# Patient Record
Sex: Male | Born: 1945
Health system: Southern US, Community
[De-identification: ages and names within clinical notes are randomized; demographics above are authoritative.]

## PROBLEM LIST (undated history)

## (undated) DIAGNOSIS — J449 Chronic obstructive pulmonary disease, unspecified: Secondary | ICD-10-CM

## (undated) DIAGNOSIS — J302 Other seasonal allergic rhinitis: Secondary | ICD-10-CM

## (undated) DIAGNOSIS — H409 Unspecified glaucoma: Secondary | ICD-10-CM

## (undated) DIAGNOSIS — I10 Essential (primary) hypertension: Secondary | ICD-10-CM

## (undated) HISTORY — DX: Chronic obstructive pulmonary disease, unspecified: J44.9

## (undated) HISTORY — DX: Other seasonal allergic rhinitis: J30.2

## (undated) HISTORY — PX: HERNIA REPAIR: SHX51

## (undated) HISTORY — DX: Essential (primary) hypertension: I10

## (undated) HISTORY — DX: Unspecified glaucoma: H40.9

---

## 2007-11-22 ENCOUNTER — Encounter: Admission: RE | Admit: 2007-11-22 | Discharge: 2007-11-22 | Payer: Self-pay | Admitting: Orthopedic Surgery

## 2008-02-19 ENCOUNTER — Encounter: Admission: RE | Admit: 2008-02-19 | Discharge: 2008-02-23 | Payer: Self-pay | Admitting: Orthopedic Surgery

## 2013-03-05 ENCOUNTER — Ambulatory Visit
Admission: RE | Admit: 2013-03-05 | Discharge: 2013-03-05 | Disposition: A | Discharge status: Home / Self Care | Payer: No Typology Code available for payment source | Source: Ambulatory Visit | Attending: Family Medicine | Admitting: Family Medicine

## 2013-03-05 ENCOUNTER — Other Ambulatory Visit: Payer: Self-pay | Admitting: Family Medicine

## 2013-03-05 DIAGNOSIS — Z006 Encounter for examination for normal comparison and control in clinical research program: Secondary | ICD-10-CM

## 2014-11-11 ENCOUNTER — Other Ambulatory Visit: Payer: Self-pay | Admitting: Chiropractic Medicine

## 2014-11-11 ENCOUNTER — Ambulatory Visit
Admission: RE | Admit: 2014-11-11 | Discharge: 2014-11-11 | Disposition: A | Discharge status: Home / Self Care | Payer: Medicare Other | Source: Ambulatory Visit | Attending: Chiropractic Medicine | Admitting: Chiropractic Medicine

## 2014-11-11 DIAGNOSIS — M542 Cervicalgia: Secondary | ICD-10-CM

## 2015-03-11 ENCOUNTER — Ambulatory Visit
Admission: RE | Admit: 2015-03-11 | Discharge: 2015-03-11 | Disposition: A | Discharge status: Home / Self Care | Payer: No Typology Code available for payment source | Source: Ambulatory Visit | Attending: Family Medicine | Admitting: Family Medicine

## 2015-03-11 ENCOUNTER — Other Ambulatory Visit: Payer: Self-pay | Admitting: Family Medicine

## 2015-03-11 DIAGNOSIS — Z006 Encounter for examination for normal comparison and control in clinical research program: Secondary | ICD-10-CM

## 2016-10-07 DIAGNOSIS — R0600 Dyspnea, unspecified: Secondary | ICD-10-CM | POA: Diagnosis not present

## 2016-10-07 DIAGNOSIS — R0689 Other abnormalities of breathing: Secondary | ICD-10-CM | POA: Diagnosis not present

## 2016-10-07 DIAGNOSIS — R0602 Shortness of breath: Secondary | ICD-10-CM | POA: Diagnosis not present

## 2016-10-07 DIAGNOSIS — R069 Unspecified abnormalities of breathing: Secondary | ICD-10-CM | POA: Diagnosis not present

## 2016-10-07 DIAGNOSIS — J441 Chronic obstructive pulmonary disease with (acute) exacerbation: Secondary | ICD-10-CM | POA: Diagnosis not present

## 2016-10-07 DIAGNOSIS — J449 Chronic obstructive pulmonary disease, unspecified: Secondary | ICD-10-CM | POA: Diagnosis not present

## 2016-10-08 DIAGNOSIS — H401122 Primary open-angle glaucoma, left eye, moderate stage: Secondary | ICD-10-CM | POA: Diagnosis not present

## 2016-11-02 DIAGNOSIS — I10 Essential (primary) hypertension: Secondary | ICD-10-CM | POA: Diagnosis not present

## 2016-11-02 DIAGNOSIS — J449 Chronic obstructive pulmonary disease, unspecified: Secondary | ICD-10-CM | POA: Diagnosis not present

## 2016-11-02 DIAGNOSIS — Z6823 Body mass index (BMI) 23.0-23.9, adult: Secondary | ICD-10-CM | POA: Diagnosis not present

## 2016-11-11 DIAGNOSIS — Z6822 Body mass index (BMI) 22.0-22.9, adult: Secondary | ICD-10-CM | POA: Diagnosis not present

## 2016-11-11 DIAGNOSIS — Z1211 Encounter for screening for malignant neoplasm of colon: Secondary | ICD-10-CM | POA: Diagnosis not present

## 2016-11-11 DIAGNOSIS — Z125 Encounter for screening for malignant neoplasm of prostate: Secondary | ICD-10-CM | POA: Diagnosis not present

## 2016-11-11 DIAGNOSIS — Z13220 Encounter for screening for lipoid disorders: Secondary | ICD-10-CM | POA: Diagnosis not present

## 2016-11-11 DIAGNOSIS — Z0000 Encounter for general adult medical examination without abnormal findings: Secondary | ICD-10-CM | POA: Diagnosis not present

## 2016-11-11 DIAGNOSIS — Z131 Encounter for screening for diabetes mellitus: Secondary | ICD-10-CM | POA: Diagnosis not present

## 2016-11-12 DIAGNOSIS — J449 Chronic obstructive pulmonary disease, unspecified: Secondary | ICD-10-CM | POA: Diagnosis not present

## 2016-11-19 DIAGNOSIS — H401122 Primary open-angle glaucoma, left eye, moderate stage: Secondary | ICD-10-CM | POA: Diagnosis not present

## 2016-12-02 DIAGNOSIS — H401122 Primary open-angle glaucoma, left eye, moderate stage: Secondary | ICD-10-CM | POA: Diagnosis not present

## 2016-12-13 DIAGNOSIS — H25812 Combined forms of age-related cataract, left eye: Secondary | ICD-10-CM | POA: Diagnosis not present

## 2016-12-13 DIAGNOSIS — J449 Chronic obstructive pulmonary disease, unspecified: Secondary | ICD-10-CM | POA: Diagnosis not present

## 2016-12-22 DIAGNOSIS — Z01818 Encounter for other preprocedural examination: Secondary | ICD-10-CM | POA: Diagnosis not present

## 2016-12-22 DIAGNOSIS — H2512 Age-related nuclear cataract, left eye: Secondary | ICD-10-CM | POA: Diagnosis not present

## 2016-12-22 DIAGNOSIS — H25812 Combined forms of age-related cataract, left eye: Secondary | ICD-10-CM | POA: Diagnosis not present

## 2017-01-10 DIAGNOSIS — M25511 Pain in right shoulder: Secondary | ICD-10-CM | POA: Diagnosis not present

## 2017-01-10 DIAGNOSIS — M62838 Other muscle spasm: Secondary | ICD-10-CM | POA: Diagnosis not present

## 2017-01-10 DIAGNOSIS — I700 Atherosclerosis of aorta: Secondary | ICD-10-CM | POA: Diagnosis not present

## 2017-01-11 DIAGNOSIS — M62838 Other muscle spasm: Secondary | ICD-10-CM | POA: Diagnosis not present

## 2017-01-11 DIAGNOSIS — Z9189 Other specified personal risk factors, not elsewhere classified: Secondary | ICD-10-CM | POA: Diagnosis not present

## 2017-01-11 DIAGNOSIS — Z6822 Body mass index (BMI) 22.0-22.9, adult: Secondary | ICD-10-CM | POA: Diagnosis not present

## 2017-01-12 DIAGNOSIS — J449 Chronic obstructive pulmonary disease, unspecified: Secondary | ICD-10-CM | POA: Diagnosis not present

## 2017-01-20 DIAGNOSIS — Z961 Presence of intraocular lens: Secondary | ICD-10-CM | POA: Diagnosis not present

## 2017-02-10 DIAGNOSIS — Z1389 Encounter for screening for other disorder: Secondary | ICD-10-CM | POA: Diagnosis not present

## 2017-02-10 DIAGNOSIS — Z139 Encounter for screening, unspecified: Secondary | ICD-10-CM | POA: Diagnosis not present

## 2017-02-10 DIAGNOSIS — Z6823 Body mass index (BMI) 23.0-23.9, adult: Secondary | ICD-10-CM | POA: Diagnosis not present

## 2017-02-10 DIAGNOSIS — I209 Angina pectoris, unspecified: Secondary | ICD-10-CM | POA: Diagnosis not present

## 2017-02-10 DIAGNOSIS — J449 Chronic obstructive pulmonary disease, unspecified: Secondary | ICD-10-CM | POA: Diagnosis not present

## 2017-02-10 DIAGNOSIS — R7309 Other abnormal glucose: Secondary | ICD-10-CM | POA: Diagnosis not present

## 2017-02-10 DIAGNOSIS — I10 Essential (primary) hypertension: Secondary | ICD-10-CM | POA: Diagnosis not present

## 2017-02-10 DIAGNOSIS — J302 Other seasonal allergic rhinitis: Secondary | ICD-10-CM | POA: Diagnosis not present

## 2017-02-11 DIAGNOSIS — J302 Other seasonal allergic rhinitis: Secondary | ICD-10-CM | POA: Diagnosis not present

## 2017-02-12 DIAGNOSIS — J449 Chronic obstructive pulmonary disease, unspecified: Secondary | ICD-10-CM | POA: Diagnosis not present

## 2017-02-14 DIAGNOSIS — J302 Other seasonal allergic rhinitis: Secondary | ICD-10-CM | POA: Diagnosis not present

## 2017-02-15 DIAGNOSIS — J302 Other seasonal allergic rhinitis: Secondary | ICD-10-CM | POA: Diagnosis not present

## 2017-02-16 DIAGNOSIS — J302 Other seasonal allergic rhinitis: Secondary | ICD-10-CM | POA: Diagnosis not present

## 2017-02-17 DIAGNOSIS — J302 Other seasonal allergic rhinitis: Secondary | ICD-10-CM | POA: Diagnosis not present

## 2017-02-18 DIAGNOSIS — J302 Other seasonal allergic rhinitis: Secondary | ICD-10-CM | POA: Diagnosis not present

## 2017-02-21 DIAGNOSIS — J302 Other seasonal allergic rhinitis: Secondary | ICD-10-CM | POA: Diagnosis not present

## 2017-03-14 DIAGNOSIS — J449 Chronic obstructive pulmonary disease, unspecified: Secondary | ICD-10-CM | POA: Diagnosis not present

## 2017-03-29 DIAGNOSIS — Z6822 Body mass index (BMI) 22.0-22.9, adult: Secondary | ICD-10-CM | POA: Diagnosis not present

## 2017-03-29 DIAGNOSIS — J181 Lobar pneumonia, unspecified organism: Secondary | ICD-10-CM | POA: Diagnosis not present

## 2017-03-29 DIAGNOSIS — Z139 Encounter for screening, unspecified: Secondary | ICD-10-CM | POA: Diagnosis not present

## 2017-03-30 DIAGNOSIS — R0602 Shortness of breath: Secondary | ICD-10-CM | POA: Diagnosis not present

## 2017-03-30 DIAGNOSIS — J181 Lobar pneumonia, unspecified organism: Secondary | ICD-10-CM | POA: Diagnosis not present

## 2017-03-30 DIAGNOSIS — R05 Cough: Secondary | ICD-10-CM | POA: Diagnosis not present

## 2017-03-30 DIAGNOSIS — Z6822 Body mass index (BMI) 22.0-22.9, adult: Secondary | ICD-10-CM | POA: Diagnosis not present

## 2017-04-07 DIAGNOSIS — J181 Lobar pneumonia, unspecified organism: Secondary | ICD-10-CM | POA: Diagnosis not present

## 2017-04-07 DIAGNOSIS — Z6822 Body mass index (BMI) 22.0-22.9, adult: Secondary | ICD-10-CM | POA: Diagnosis not present

## 2017-04-13 DIAGNOSIS — H40012 Open angle with borderline findings, low risk, left eye: Secondary | ICD-10-CM | POA: Diagnosis not present

## 2017-04-14 DIAGNOSIS — J449 Chronic obstructive pulmonary disease, unspecified: Secondary | ICD-10-CM | POA: Diagnosis not present

## 2017-05-02 DIAGNOSIS — E7889 Other lipoprotein metabolism disorders: Secondary | ICD-10-CM | POA: Diagnosis not present

## 2017-05-02 DIAGNOSIS — I10 Essential (primary) hypertension: Secondary | ICD-10-CM | POA: Diagnosis not present

## 2017-05-10 DIAGNOSIS — Z6822 Body mass index (BMI) 22.0-22.9, adult: Secondary | ICD-10-CM | POA: Diagnosis not present

## 2017-05-10 DIAGNOSIS — J449 Chronic obstructive pulmonary disease, unspecified: Secondary | ICD-10-CM | POA: Diagnosis not present

## 2017-05-10 DIAGNOSIS — Z7189 Other specified counseling: Secondary | ICD-10-CM | POA: Diagnosis not present

## 2017-05-10 DIAGNOSIS — I10 Essential (primary) hypertension: Secondary | ICD-10-CM | POA: Diagnosis not present

## 2017-05-11 DIAGNOSIS — Z9889 Other specified postprocedural states: Secondary | ICD-10-CM | POA: Diagnosis not present

## 2017-05-11 DIAGNOSIS — H401123 Primary open-angle glaucoma, left eye, severe stage: Secondary | ICD-10-CM | POA: Diagnosis not present

## 2017-05-15 DIAGNOSIS — J449 Chronic obstructive pulmonary disease, unspecified: Secondary | ICD-10-CM | POA: Diagnosis not present

## 2017-06-14 DIAGNOSIS — J449 Chronic obstructive pulmonary disease, unspecified: Secondary | ICD-10-CM | POA: Diagnosis not present

## 2017-07-13 DIAGNOSIS — H401123 Primary open-angle glaucoma, left eye, severe stage: Secondary | ICD-10-CM | POA: Diagnosis not present

## 2017-07-13 DIAGNOSIS — Z9889 Other specified postprocedural states: Secondary | ICD-10-CM | POA: Diagnosis not present

## 2017-07-15 DIAGNOSIS — J449 Chronic obstructive pulmonary disease, unspecified: Secondary | ICD-10-CM | POA: Diagnosis not present

## 2017-08-02 DIAGNOSIS — Z6822 Body mass index (BMI) 22.0-22.9, adult: Secondary | ICD-10-CM | POA: Diagnosis not present

## 2017-08-02 DIAGNOSIS — J441 Chronic obstructive pulmonary disease with (acute) exacerbation: Secondary | ICD-10-CM | POA: Diagnosis not present

## 2017-08-14 DIAGNOSIS — J449 Chronic obstructive pulmonary disease, unspecified: Secondary | ICD-10-CM | POA: Diagnosis not present

## 2017-09-14 DIAGNOSIS — J449 Chronic obstructive pulmonary disease, unspecified: Secondary | ICD-10-CM | POA: Diagnosis not present

## 2017-10-04 DIAGNOSIS — Z6822 Body mass index (BMI) 22.0-22.9, adult: Secondary | ICD-10-CM | POA: Diagnosis not present

## 2017-10-04 DIAGNOSIS — L97519 Non-pressure chronic ulcer of other part of right foot with unspecified severity: Secondary | ICD-10-CM | POA: Diagnosis not present

## 2017-10-04 DIAGNOSIS — M79674 Pain in right toe(s): Secondary | ICD-10-CM | POA: Diagnosis not present

## 2017-10-04 DIAGNOSIS — Z7189 Other specified counseling: Secondary | ICD-10-CM | POA: Diagnosis not present

## 2017-10-05 DIAGNOSIS — M79674 Pain in right toe(s): Secondary | ICD-10-CM | POA: Diagnosis not present

## 2017-10-05 DIAGNOSIS — M216X9 Other acquired deformities of unspecified foot: Secondary | ICD-10-CM | POA: Diagnosis not present

## 2017-10-05 DIAGNOSIS — M19071 Primary osteoarthritis, right ankle and foot: Secondary | ICD-10-CM | POA: Diagnosis not present

## 2017-10-15 DIAGNOSIS — J449 Chronic obstructive pulmonary disease, unspecified: Secondary | ICD-10-CM | POA: Diagnosis not present

## 2017-10-20 DIAGNOSIS — H54415A Blindness right eye category 5, normal vision left eye: Secondary | ICD-10-CM | POA: Diagnosis not present

## 2017-10-20 DIAGNOSIS — H401123 Primary open-angle glaucoma, left eye, severe stage: Secondary | ICD-10-CM | POA: Diagnosis not present

## 2017-10-20 DIAGNOSIS — H04123 Dry eye syndrome of bilateral lacrimal glands: Secondary | ICD-10-CM | POA: Diagnosis not present

## 2017-10-21 ENCOUNTER — Ambulatory Visit: Payer: Self-pay | Admitting: Sports Medicine

## 2017-10-31 DIAGNOSIS — I10 Essential (primary) hypertension: Secondary | ICD-10-CM | POA: Diagnosis not present

## 2017-10-31 DIAGNOSIS — E7889 Other lipoprotein metabolism disorders: Secondary | ICD-10-CM | POA: Diagnosis not present

## 2017-10-31 DIAGNOSIS — Z125 Encounter for screening for malignant neoplasm of prostate: Secondary | ICD-10-CM | POA: Diagnosis not present

## 2017-11-07 DIAGNOSIS — Z139 Encounter for screening, unspecified: Secondary | ICD-10-CM | POA: Diagnosis not present

## 2017-11-07 DIAGNOSIS — J449 Chronic obstructive pulmonary disease, unspecified: Secondary | ICD-10-CM | POA: Diagnosis not present

## 2017-11-07 DIAGNOSIS — R972 Elevated prostate specific antigen [PSA]: Secondary | ICD-10-CM | POA: Diagnosis not present

## 2017-11-07 DIAGNOSIS — I10 Essential (primary) hypertension: Secondary | ICD-10-CM | POA: Diagnosis not present

## 2017-11-07 DIAGNOSIS — Z0000 Encounter for general adult medical examination without abnormal findings: Secondary | ICD-10-CM | POA: Diagnosis not present

## 2017-11-07 DIAGNOSIS — Z1331 Encounter for screening for depression: Secondary | ICD-10-CM | POA: Diagnosis not present

## 2017-11-12 DIAGNOSIS — J449 Chronic obstructive pulmonary disease, unspecified: Secondary | ICD-10-CM | POA: Diagnosis not present

## 2017-11-29 DIAGNOSIS — H02135 Senile ectropion of left lower eyelid: Secondary | ICD-10-CM | POA: Diagnosis not present

## 2017-11-29 DIAGNOSIS — H02132 Senile ectropion of right lower eyelid: Secondary | ICD-10-CM | POA: Diagnosis not present

## 2017-12-07 DIAGNOSIS — J449 Chronic obstructive pulmonary disease, unspecified: Secondary | ICD-10-CM | POA: Diagnosis not present

## 2017-12-07 DIAGNOSIS — I10 Essential (primary) hypertension: Secondary | ICD-10-CM | POA: Diagnosis not present

## 2017-12-14 DIAGNOSIS — J449 Chronic obstructive pulmonary disease, unspecified: Secondary | ICD-10-CM | POA: Diagnosis not present

## 2017-12-14 DIAGNOSIS — Z6822 Body mass index (BMI) 22.0-22.9, adult: Secondary | ICD-10-CM | POA: Diagnosis not present

## 2017-12-20 ENCOUNTER — Encounter: Payer: Self-pay | Admitting: Pulmonary Disease

## 2017-12-21 ENCOUNTER — Ambulatory Visit: Payer: Medicare Other | Admitting: Pulmonary Disease

## 2017-12-21 ENCOUNTER — Encounter: Payer: Self-pay | Admitting: Pulmonary Disease

## 2017-12-21 VITALS — BP 108/74 | HR 96 | Ht 72.0 in | Wt 158.6 lb

## 2017-12-21 DIAGNOSIS — J449 Chronic obstructive pulmonary disease, unspecified: Secondary | ICD-10-CM

## 2017-12-21 DIAGNOSIS — R911 Solitary pulmonary nodule: Secondary | ICD-10-CM

## 2017-12-21 DIAGNOSIS — Z720 Tobacco use: Secondary | ICD-10-CM | POA: Diagnosis not present

## 2017-12-21 HISTORY — DX: Tobacco use: Z72.0

## 2017-12-21 HISTORY — DX: Solitary pulmonary nodule: R91.1

## 2017-12-21 MED ORDER — FLUTICASONE FUROATE-VILANTEROL 200-25 MCG/INH IN AEPB: 1.0000 | INHALATION_SPRAY | Freq: Every day | RESPIRATORY_TRACT | 0 refills | Status: DC

## 2017-12-21 MED ORDER — TIOTROPIUM BROMIDE MONOHYDRATE 18 MCG IN CAPS: 18.0000 ug | ORAL_CAPSULE | Freq: Every day | RESPIRATORY_TRACT | 6 refills | Status: DC

## 2017-12-21 NOTE — Progress Notes (Signed)
Subjective:    Patient ID: Daniel Tran, male    DOB: 22-Sep-1945, 72 y.o.   MRN: [id number]  HPI  72 year old smoker presents to establish care for COPD. He smoked 2 packs/day starting at age 72 until about 2 years ago, more than 80 pack years.  He now smokes 4-5 cigars daily.  He reports increased shortness of breath ongoing for about 6 months.  He was diagnosed with "COPD" and maintained on Spiriva for many years.  He established with a new PCP 3 months ago and at that time he was started on albuterol and Atrovent nebs via nebulizer and Spiriva was discontinued.  He was also started on budesonide nebs which caused him to develop a cough.  He self discontinued this and his next PCP visit he was given a sample of glycopyrrolate nebs Seebri -he does not feel that this is helped him much. He is able to carry out his daily activities including mowing the yard or walk around in the store. He denies frequent chest colds.  He does report a daily cough productive of clear white sputum.  He reports intermittent wheezing. He is accompanied by his daughter and wife were very concerned about his breathing. 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  He denies any exposure to asbestos  Significant tests/ events reviewed  Spirometry 12/2017 -download ratio of 38, FEV1 26%, FVC 52%  CT chest 08/2015 showed 6 mm left lower lobe nodule and scattered pleural-based calcifications   Past Medical History:  Diagnosis Date  . COPD (chronic obstructive pulmonary disease) (HCC)   . Hypertension, benign   . Seasonal allergic rhinitis     No Known Allergies  Social History   Socioeconomic History  . Marital status: Married    Spouse name: Not on file  . Number of children: Not on file  . Years of education: Not on file  . Highest education level: Not on  file  Occupational History  . Not on file  Social Needs  . Financial resource strain: Not on file  . Food insecurity:    Worry: Not on file    Inability: Not on file  . Transportation needs:    Medical: Not on file    Non-medical: Not on file  Tobacco Use  . Smoking status: Current Every Day Smoker    Packs/day: 2.00    Years: 60.00    Pack years: 120.00    Types: Cigarettes, Cigars  . Smokeless tobacco: Never Used  . Tobacco comment: quit cigarettes 3 years ago but still smoking cigars  Substance and Sexual Activity  . Alcohol use: Not on file  . Drug use: Not on file  . Sexual activity: Not on file  Lifestyle  . Physical activity:    Days per week: Not on file    Minutes per session: Not on file  . Stress: Not on file  Relationships  . Social connections:    Talks on phone: Not on file    Gets together: Not on file    Attends religious service: Not on file    Active member of club or organization: Not on file    Attends meetings of clubs or organizations: Not on file    Relationship status: Not on file  . Intimate partner violence:    Fear of current or ex partner:  Not on file    Emotionally abused: Not on file    Physically abused: Not on file    Forced sexual activity: Not on file  Other Topics Concern  . Not on file  Social History Narrative  . Not on file     No family history on file. Heart disease in his father at an older age  Review of Systems  Constitutional: Positive for unexpected weight change. Negative for fever.  HENT: Positive for sore throat. Negative for congestion, dental problem, ear pain, nosebleeds, postnasal drip, rhinorrhea, sinus pressure, sneezing and trouble swallowing.   Eyes: Positive for redness and itching.  Respiratory: Positive for cough, chest tightness, shortness of breath and wheezing.   Cardiovascular: Negative for palpitations and leg swelling.  Gastrointestinal: Negative for nausea and vomiting.  Genitourinary: Negative  for dysuria.  Musculoskeletal: Negative for joint swelling.  Skin: Negative for rash.  Allergic/Immunologic: Negative.  Negative for environmental allergies, food allergies and immunocompromised state.  Neurological: Negative for headaches.  Hematological: Does not bruise/bleed easily.  Psychiatric/Behavioral: Negative for dysphoric mood. The patient is not nervous/anxious.        Objective:   Physical Exam  Gen. Pleasant, well-nourished, in no distress, normal affect ENT - no lesions, no post nasal drip Neck: No JVD, no thyromegaly, no carotid bruits Lungs: no use of accessory muscles, no dullness to percussion, decreased  without rales or rhonchi  Cardiovascular: Rhythm regular, heart sounds  normal, no murmurs or gallops, no peripheral edema Abdomen: soft and non-tender, no hepatosplenomegaly, BS normal. Musculoskeletal: No deformities, no cyanosis or clubbing Neuro:  alert, non focal       Assessment & Plan:

## 2017-12-21 NOTE — Assessment & Plan Note (Signed)
Smoking cessation paramount here and was emphasized to patient and family

## 2017-12-21 NOTE — Patient Instructions (Signed)
You have severe COPD, lung capacity is a 26%.  You have to stop smoking completely!.  Start Spiriva once daily Start Brio once daily, sample-rinse mouth after use Call back for prescription if this works  CT chest without contrast to follow-up on nodule in your left lower lung

## 2017-12-21 NOTE — Assessment & Plan Note (Signed)
We will obtain 2-year follow-up CT scan, and if this shows stable nodule then would be presumed benign

## 2017-12-21 NOTE — Assessment & Plan Note (Signed)
Seems to be quite advanced COPD although he seems to have retained his functional status. Today we clarified his medication regimen.  Since Spiriva has worked for him in the past we will continue this.  I have asked him to stop budesonide and Seebri. We will initiate once a day steroid/L ABA combinations such as Breo -a sample was given to him today and he will call back for prescription of this works.  If this is too expensive, we will can provide him with alternatives  He would be a good candidate for pulmonary rehab in the future.  We discussed signs and symptoms of exacerbation and he would call us should his sputum changed color or if he has to use his DuoNeb's more than 3 times daily

## 2017-12-22 ENCOUNTER — Telehealth: Payer: Self-pay | Admitting: Pulmonary Disease

## 2017-12-22 NOTE — Telephone Encounter (Signed)
Called and spoke with pt who wanted to make sure he was supposed to be taking the breo and spiriva once a day as his daughter thought the meds were supposed to be twice daily.  I stated to pt both meds are once daily medications.  Pt expressed understanding. Nothing further needed at this time.

## 2018-01-05 ENCOUNTER — Inpatient Hospital Stay: Admission: RE | Admit: 2018-01-05 | Payer: Medicare Other | Source: Ambulatory Visit

## 2018-01-06 DIAGNOSIS — J449 Chronic obstructive pulmonary disease, unspecified: Secondary | ICD-10-CM | POA: Diagnosis not present

## 2018-01-06 DIAGNOSIS — Z6822 Body mass index (BMI) 22.0-22.9, adult: Secondary | ICD-10-CM | POA: Diagnosis not present

## 2018-01-16 ENCOUNTER — Ambulatory Visit (INDEPENDENT_AMBULATORY_CARE_PROVIDER_SITE_OTHER)
Admission: RE | Admit: 2018-01-16 | Discharge: 2018-01-16 | Disposition: A | Discharge status: Home / Self Care | Payer: Medicare Other | Source: Ambulatory Visit | Attending: Pulmonary Disease | Admitting: Pulmonary Disease

## 2018-01-16 DIAGNOSIS — R911 Solitary pulmonary nodule: Secondary | ICD-10-CM | POA: Diagnosis not present

## 2018-01-16 DIAGNOSIS — J449 Chronic obstructive pulmonary disease, unspecified: Secondary | ICD-10-CM | POA: Diagnosis not present

## 2018-01-16 DIAGNOSIS — J439 Emphysema, unspecified: Secondary | ICD-10-CM | POA: Diagnosis not present

## 2018-01-18 ENCOUNTER — Telehealth: Payer: Self-pay | Admitting: Pulmonary Disease

## 2018-01-18 MED ORDER — FLUTICASONE FUROATE-VILANTEROL 200-25 MCG/INH IN AEPB: 1.0000 | INHALATION_SPRAY | Freq: Every day | RESPIRATORY_TRACT | 5 refills | Status: DC

## 2018-01-18 NOTE — Telephone Encounter (Signed)
Spoke with pt. He is requesting a prescription for Breo to be sent to his pharmacy. Rx has been sent in. Nothing further was needed.

## 2018-01-20 ENCOUNTER — Ambulatory Visit (INDEPENDENT_AMBULATORY_CARE_PROVIDER_SITE_OTHER): Payer: Medicare Other | Admitting: Adult Health

## 2018-01-20 ENCOUNTER — Encounter: Payer: Self-pay | Admitting: Adult Health

## 2018-01-20 DIAGNOSIS — Z720 Tobacco use: Secondary | ICD-10-CM | POA: Diagnosis not present

## 2018-01-20 DIAGNOSIS — J449 Chronic obstructive pulmonary disease, unspecified: Secondary | ICD-10-CM | POA: Diagnosis not present

## 2018-01-20 DIAGNOSIS — R911 Solitary pulmonary nodule: Secondary | ICD-10-CM | POA: Diagnosis not present

## 2018-01-20 NOTE — Assessment & Plan Note (Signed)
Resolved on CT chest  Discussed and reviewed on CT chest .

## 2018-01-20 NOTE — Assessment & Plan Note (Signed)
Very severe COPD , appears to be doing well on BREO and Spriiva .  Pt education with pt and daughter regarding COPD dx .   Plan  Patient Instructions  Continue on BREO and Spiriva .  Work on not smoking .  Follow up with Dr. Vassie Loll  In 3-4 months and As needed

## 2018-01-20 NOTE — Progress Notes (Signed)
  ID: Daniel Tran, male    DOB: Jan 20, 1946, 72 y.o.   MRN: [id number]  Chief Complaint  Patient presents with  . Follow-up    COPD     Referring provider: No ref. provider found  HPI: 72 year old male, active smoker (cigars) seen for pulmonary consult December 21, 2017 to establish for COPD  Significant tests/ events reviewed  Spirometry 12/2017 -download ratio of 38, FEV1 26%, FVC 52%  CT chest 08/2015 showed 6 mm left lower lobe nodule and scattered pleural-based calcifications   01/20/2018 Follow up : COPD and lung nodule Patient returns for a one-month follow-up.  Patient was seen last visit to establish for COPD.  Patient has very severe COPD with an FEV1 at 26%.  He was recommended to take Brio and Spiriva.  Patient feels that this regimen is working well for him.  He has had no flare of cough or wheezing.  Patient says he is very active.  Has some shortness of breath with heavy activity.  Denies any hemoptysis, chest pain, orthopnea or edema.  He is continue to smoke.  He smokes about 5 to 6 cigars a day.  We discussed smoking cessation.  Quit smoking information was given.  Patient had a left lower lobe nodule seen on previous CT chest December 2016  he was set up for a CT chest done on Jan 16, 2018 that showed interval resolution of left lower lobe pulmonary nodule.  Resolved nodular atelectasis.  Interval resolution of previous right lung bronchiolitis.  And bilateral emphysema.   No Known Allergies  Immunization History  Administered Date(s) Administered  . Influenza, High Dose Seasonal PF 06/24/2017  . Pneumococcal Polysaccharide-23 06/12/2017    Past Medical History:  Diagnosis Date  . COPD (chronic obstructive pulmonary disease) (HCC)   . Hypertension, benign   . Seasonal allergic rhinitis     Tobacco History: Social History   Tobacco Use  Smoking Status Current Every Day Smoker  . Packs/day: 2.00  . Years: 60.00  . Pack years: 120.00  . Types:  Cigarettes, Cigars  Smokeless Tobacco Never Used  Tobacco Comment   quit cigarettes 3 years ago but still smoking cigars 3 times a day   Ready to quit: No Counseling given: Yes Comment: quit cigarettes 3 years ago but still smoking cigars 3 times a day   Outpatient Encounter Medications as of 01/20/2018  Medication Sig  . fluticasone furoate-vilanterol (BREO ELLIPTA) 200-25 MCG/INH AEPB Inhale 1 puff into the lungs daily.  Marland Kitchen ipratropium-albuterol (DUONEB) 0.5-2.5 (3) MG/3ML SOLN   . tiotropium (SPIRIVA) 18 MCG inhalation capsule Place 1 capsule (18 mcg total) into inhaler and inhale daily.  . budesonide (PULMICORT) 0.5 MG/2ML nebulizer solution   . Glycopyrrolate (SEEBRI NEOHALER) 15.6 MCG CAPS Place into inhaler and inhale.   No facility-administered encounter medications on file as of 01/20/2018.      Review of Systems  Constitutional:   No  night sweats,  Fevers, chills,  +fatigue, or  lassitude.  HEENT:   No headaches,  Difficulty swallowing,  Tooth/dental problems, or  Sore throat,                No sneezing, itching, ear ache, nasal congestion, post nasal drip,   CV:  No chest pain,  Orthopnea, PND, swelling in lower extremities, anasarca, dizziness, palpitations, syncope.   GI  No heartburn, indigestion, abdominal pain, nausea, vomiting, diarrhea, change in bowel habits, loss of appetite, bloody stools.   Resp:   No  chest wall deformity  Skin: no rash or lesions.  GU: no dysuria, change in color of urine, no urgency or frequency.  No flank pain, no hematuria   MS:  No joint pain or swelling.  No decreased range of motion.  No back pain.    Physical Exam  BP (!) 100/58 (BP Location: Left Arm, Cuff Size: Normal)   Pulse 89   Ht 6' (1.829 m)   Wt 154 lb 9.6 oz (70.1 kg)   SpO2 97%   BMI 20.97 kg/m   GEN: A/Ox3; pleasant , NAD, elderly , thin    HEENT:  [place]/AT,  EACs-clear, TMs-wnl, NOSE-clear, THROAT-clear, no lesions, no postnasal drip or exudate noted.    NECK:  Supple w/ fair ROM; no JVD; normal carotid impulses w/o bruits; no thyromegaly or nodules palpated; no lymphadenopathy.    RESP  Decreased BS in bases ,  no accessory muscle use, no dullness to percussion  CARD:  RRR, no m/r/g, no peripheral edema, pulses intact, no cyanosis or clubbing.  GI:   Soft & nt; nml bowel sounds; no organomegaly or masses detected.   Musco: Warm bil, no deformities or joint swelling noted.   Neuro: alert, no focal deficits noted.    Skin: Warm, no lesions or rashes    Lab Results:  CBC No results found for: WBC, RBC, HGB, HCT, PLT, MCV, MCH, MCHC, RDW, LYMPHSABS, MONOABS, EOSABS, BASOSABS  BMET No results found for: NA, K, CL, CO2, GLUCOSE, BUN, CREATININE, CALCIUM, GFRNONAA, GFRAA  BNP No results found for: BNP  ProBNP No results found for: PROBNP  Imaging: Ct Chest Wo Contrast  Result Date: 01/16/2018 CLINICAL DATA:  Follow-up lung nodule, 6 mm, LEFT lower lobe. EXAM: CT CHEST WITHOUT CONTRAST TECHNIQUE: Multidetector CT imaging of the chest was performed following the standard protocol without IV contrast. COMPARISON:  Chest CTs dated 08/17/2015 and 09/02/2012. FINDINGS: Cardiovascular: Heart size is normal. No pericardial effusion. No thoracic aortic aneurysm. Scattered mild aortic atherosclerosis. Mediastinum/Nodes: No mass or enlarged lymph nodes within the mediastinum or perihilar regions. Esophagus appears normal. Trachea and central bronchi are unremarkable. Lungs/Pleura: The previously described 6 mm nodular opacity in the LEFT lower lobe is no longer seen, resolved in the interval indicating benign etiology, presumably resolved nodular atelectasis or bronchiolitis. The previously described bronchiolitis involving the RIGHT lung is also resolved. The previously described pleural based nodule within the RIGHT upper lobe is not significantly changed compared to the previous studies suggesting benignity. No new lung findings. No pleural  effusion or pneumothorax. Stable emphysematous changes within the upper lobes, mild to moderate in degree. Upper Abdomen: No acute findings. Musculoskeletal: No acute or suspicious osseous findings. Mild scoliosis of the thoracic spine. Degenerative spurring within the lower cervical spine, incompletely imaged. IMPRESSION: 1. Interval resolution of the LEFT lower lobe pulmonary nodule, confirming benign etiology, presumably resolved nodular atelectasis or bronchiolitis. No additional follow-up imaging is necessary. 2. Also, interval resolution of the previous RIGHT lung bronchiolitis. 3. No acute findings. 4. Bilateral upper lobe emphysematous changes, mild to moderate in degree. 5. Mild aortic atherosclerosis. Aortic Atherosclerosis (ICD10-I70.0) and Emphysema (ICD10-J43.9). Electronically Signed   By: Bary Richard M.D.   On: 01/16/2018 21:59   Reviewed independently .   Assessment & Plan:   COPD (chronic obstructive pulmonary disease) (HCC) Very severe COPD , appears to be doing well on BREO and Spriiva .  Pt education with pt and daughter regarding COPD dx .   Plan  Patient Instructions  Continue on BREO and Spiriva .  Work on not smoking .  Follow up with Dr. Vassie Loll  In 3-4 months and As needed        Pulmonary nodule, left Resolved on CT chest  Discussed and reviewed on CT chest .   Tobacco abuse Smoking cessation      Rubye Oaks, NP 01/20/2018

## 2018-01-20 NOTE — Patient Instructions (Addendum)
Continue on BREO and Spiriva .  Work on not smoking .  Follow up with Dr. Vassie Loll  In 3-4 months and As needed

## 2018-01-20 NOTE — Assessment & Plan Note (Signed)
Smoking cessation  

## 2018-01-24 NOTE — Progress Notes (Signed)
Reviewed & agree with plan  

## 2018-02-06 ENCOUNTER — Telehealth: Payer: Self-pay | Admitting: Pulmonary Disease

## 2018-02-06 NOTE — Telephone Encounter (Signed)
[facility] Was told no clinical information is currently needed. All medications (spiriva, breo, and proair) are on tier 3 and are the lowest cost medications currently for patient. No PA currently needed. Nothing further needed.

## 2018-02-22 DIAGNOSIS — H401122 Primary open-angle glaucoma, left eye, moderate stage: Secondary | ICD-10-CM | POA: Diagnosis not present

## 2018-03-05 DIAGNOSIS — J449 Chronic obstructive pulmonary disease, unspecified: Secondary | ICD-10-CM | POA: Diagnosis not present

## 2018-03-05 DIAGNOSIS — I10 Essential (primary) hypertension: Secondary | ICD-10-CM | POA: Diagnosis not present

## 2018-03-07 DIAGNOSIS — Z6822 Body mass index (BMI) 22.0-22.9, adult: Secondary | ICD-10-CM | POA: Diagnosis not present

## 2018-03-07 DIAGNOSIS — L819 Disorder of pigmentation, unspecified: Secondary | ICD-10-CM | POA: Diagnosis not present

## 2018-03-07 DIAGNOSIS — L309 Dermatitis, unspecified: Secondary | ICD-10-CM | POA: Diagnosis not present

## 2018-03-31 DIAGNOSIS — Z7189 Other specified counseling: Secondary | ICD-10-CM | POA: Diagnosis not present

## 2018-03-31 DIAGNOSIS — Z720 Tobacco use: Secondary | ICD-10-CM | POA: Diagnosis not present

## 2018-03-31 DIAGNOSIS — H6090 Unspecified otitis externa, unspecified ear: Secondary | ICD-10-CM | POA: Diagnosis not present

## 2018-03-31 DIAGNOSIS — Z6822 Body mass index (BMI) 22.0-22.9, adult: Secondary | ICD-10-CM | POA: Diagnosis not present

## 2018-04-05 DIAGNOSIS — I10 Essential (primary) hypertension: Secondary | ICD-10-CM | POA: Diagnosis not present

## 2018-04-05 DIAGNOSIS — J449 Chronic obstructive pulmonary disease, unspecified: Secondary | ICD-10-CM | POA: Diagnosis not present

## 2018-05-05 DIAGNOSIS — J449 Chronic obstructive pulmonary disease, unspecified: Secondary | ICD-10-CM | POA: Diagnosis not present

## 2018-05-05 DIAGNOSIS — I10 Essential (primary) hypertension: Secondary | ICD-10-CM | POA: Diagnosis not present

## 2018-05-23 DIAGNOSIS — Z6822 Body mass index (BMI) 22.0-22.9, adult: Secondary | ICD-10-CM | POA: Diagnosis not present

## 2018-05-23 DIAGNOSIS — J441 Chronic obstructive pulmonary disease with (acute) exacerbation: Secondary | ICD-10-CM | POA: Diagnosis not present

## 2018-06-02 ENCOUNTER — Ambulatory Visit: Payer: Medicare Other | Admitting: Pulmonary Disease

## 2018-06-05 DIAGNOSIS — J449 Chronic obstructive pulmonary disease, unspecified: Secondary | ICD-10-CM | POA: Diagnosis not present

## 2018-06-05 DIAGNOSIS — J441 Chronic obstructive pulmonary disease with (acute) exacerbation: Secondary | ICD-10-CM | POA: Diagnosis not present

## 2018-06-05 DIAGNOSIS — I10 Essential (primary) hypertension: Secondary | ICD-10-CM | POA: Diagnosis not present

## 2018-06-08 ENCOUNTER — Encounter: Payer: Self-pay | Admitting: Pulmonary Disease

## 2018-06-08 ENCOUNTER — Ambulatory Visit (INDEPENDENT_AMBULATORY_CARE_PROVIDER_SITE_OTHER): Payer: Medicare Other | Admitting: Pulmonary Disease

## 2018-06-08 DIAGNOSIS — J449 Chronic obstructive pulmonary disease, unspecified: Secondary | ICD-10-CM

## 2018-06-08 DIAGNOSIS — Z720 Tobacco use: Secondary | ICD-10-CM | POA: Diagnosis not present

## 2018-06-08 DIAGNOSIS — Z23 Encounter for immunization: Secondary | ICD-10-CM | POA: Diagnosis not present

## 2018-06-08 MED ORDER — FLUTICASONE FUROATE-VILANTEROL 200-25 MCG/INH IN AEPB: 1.0000 | INHALATION_SPRAY | Freq: Every day | RESPIRATORY_TRACT | 5 refills | Status: DC

## 2018-06-08 NOTE — Assessment & Plan Note (Addendum)
Flu shot today. Stay on Breo and Spiriva.

## 2018-06-08 NOTE — Progress Notes (Signed)
   Subjective:    Patient ID: Rodell Marrs, male    DOB: 1945-12-02, 72 y.o.   MRN: [id number]  HPI  72 year old smoker for follow-up of severe COPD. On his last visit he was changed from nebs to [place]  He continues to smoke, about half pack per day. He was sick with a chest cold last week and saw his PCP and was given prednisone and a nebulizer treatment, and is now back to baseline. He is compliant with Breo occasionally takes albuterol instead of the Spiriva  CT chest 01/2018 was personally reviewed which shows resolution of nodule   Significant tests/ events reviewed  Spirometry 12/2017-download ratio of 38, FEV1 26%, FVC 52%  CT chest 08/2015 showed 6 mm left lower lobe nodule and scattered pleural-based calcifications >> resolved 01/2018  Review of Systems neg for any significant sore throat, dysphagia, itching, sneezing, nasal congestion or excess/ purulent secretions, fever, chills, sweats, unintended wt loss, pleuritic or exertional cp, hempoptysis, orthopnea pnd or change in chronic leg swelling. Also denies presyncope, palpitations, heartburn, abdominal pain, nausea, vomiting, diarrhea or change in bowel or urinary habits, dysuria,hematuria, rash, arthralgias, visual complaints, headache, numbness weakness or ataxia.     Objective:   Physical Exam   Gen. Pleasant, well-nourished, in no distress ENT - no thrush, no post nasal drip Neck: No JVD, no thyromegaly, no carotid bruits Lungs: no use of accessory muscles, no dullness to percussion, clear without rales or rhonchi  Cardiovascular: Rhythm regular, heart sounds  normal, no murmurs or gallops, no peripheral edema Musculoskeletal: No deformities, no cyanosis or clubbing         Assessment & Plan:

## 2018-06-08 NOTE — Addendum Note (Signed)
Addended by: Kerin Ransom on: 06/08/2018 03:03 PM   Modules accepted: Orders

## 2018-06-08 NOTE — Patient Instructions (Signed)
Flu shot today. Stay on Breo and Spiriva. Make another attempt to quit -okay to use Chantix and nicotine patch

## 2018-06-08 NOTE — Assessment & Plan Note (Signed)
Make another attempt to quit -okay to use Chantix and nicotine patch

## 2018-06-12 DIAGNOSIS — Z720 Tobacco use: Secondary | ICD-10-CM | POA: Diagnosis not present

## 2018-06-12 DIAGNOSIS — Z6822 Body mass index (BMI) 22.0-22.9, adult: Secondary | ICD-10-CM | POA: Diagnosis not present

## 2018-06-12 DIAGNOSIS — S29012A Strain of muscle and tendon of back wall of thorax, initial encounter: Secondary | ICD-10-CM | POA: Diagnosis not present

## 2018-06-28 DIAGNOSIS — H401122 Primary open-angle glaucoma, left eye, moderate stage: Secondary | ICD-10-CM | POA: Diagnosis not present

## 2018-07-06 DIAGNOSIS — J449 Chronic obstructive pulmonary disease, unspecified: Secondary | ICD-10-CM | POA: Diagnosis not present

## 2018-07-06 DIAGNOSIS — I10 Essential (primary) hypertension: Secondary | ICD-10-CM | POA: Diagnosis not present

## 2018-07-06 DIAGNOSIS — J441 Chronic obstructive pulmonary disease with (acute) exacerbation: Secondary | ICD-10-CM | POA: Diagnosis not present

## 2018-08-05 DIAGNOSIS — I10 Essential (primary) hypertension: Secondary | ICD-10-CM | POA: Diagnosis not present

## 2018-08-05 DIAGNOSIS — J449 Chronic obstructive pulmonary disease, unspecified: Secondary | ICD-10-CM | POA: Diagnosis not present

## 2018-08-05 DIAGNOSIS — J441 Chronic obstructive pulmonary disease with (acute) exacerbation: Secondary | ICD-10-CM | POA: Diagnosis not present

## 2018-08-11 DIAGNOSIS — H1012 Acute atopic conjunctivitis, left eye: Secondary | ICD-10-CM | POA: Diagnosis not present

## 2018-08-22 DIAGNOSIS — R6889 Other general symptoms and signs: Secondary | ICD-10-CM | POA: Diagnosis not present

## 2018-08-22 DIAGNOSIS — Z6822 Body mass index (BMI) 22.0-22.9, adult: Secondary | ICD-10-CM | POA: Diagnosis not present

## 2018-08-22 DIAGNOSIS — J441 Chronic obstructive pulmonary disease with (acute) exacerbation: Secondary | ICD-10-CM | POA: Diagnosis not present

## 2018-09-01 ENCOUNTER — Telehealth: Payer: Self-pay | Admitting: Pulmonary Disease

## 2018-09-01 NOTE — Telephone Encounter (Signed)
Can offer:  Nystatin 500,000 units suspension /100,000 units/mL >>>5 mL's every 6 hours for 7 days >>>Try to retain nystatin in mouth as long as possible  Please place the order  Patient can hold Breo Ellipta at this time.  If shortness of breath worsens they will need an earlier office visit.  Elisha HeadlandBrian Arly Salminen, FNP

## 2018-09-01 NOTE — Telephone Encounter (Signed)
Patient states that he feels he may have had a [facility] but has been taking this for awhile now. This started on 08/29/18 He states his face started sweating and jaw was swelling out and the back of his mouth was sore or lump in his throat.  Dines any fever or SOB at this time   Coughing is baseline. He has stopped the breo/Spiriva on Tuesday   Medication verified he is taking all other on list as prescribed. He rises mouth after each use. Primary pulmonologist -Vassie LollAlva Last Ov 06/08/18  BM please advise Allergies as of 09/01/2018   No Known Allergies     Medication List       Accurate as of September 01, 2018 10:58 AM. Always use your most recent med list.        fluticasone furoate-vilanterol 200-25 MCG/INH Aepb Commonly known as:  BREO ELLIPTA Inhale 1 puff into the lungs daily.   ipratropium-albuterol 0.5-2.5 (3) MG/3ML Soln Commonly known as:  DUONEB   tiotropium 18 MCG inhalation capsule Commonly known as:  SPIRIVA Place 1 capsule (18 mcg total) into inhaler and inhale daily.

## 2018-09-01 NOTE — Telephone Encounter (Signed)
Called and spoke with patient he is having some irration and would like to try the Nystatin rinse . He would like to keep the apt 09/11/17 he will call back if he changes his mind.  BM please advise what dose of nystatin you would like called [facility] to [facility] [place] ?   Patient is aware and does not  need a call back.

## 2018-09-01 NOTE — Telephone Encounter (Signed)
Patient returned call, CB is [phone].

## 2018-09-01 NOTE — Telephone Encounter (Signed)
Patient can be scheduled for follow-up office visit for further evaluation.  Okay to move up January/02/2019 office visit with TP or another APP.  Patient may be a candidate for Trelegy Ellipta.  Will need further evaluation.  Does he have trouble swallowing?  Is the back of his throat irritated?If so can offer nystatin rinse to patient.  Elisha HeadlandBrian Coltyn Hanning, NP

## 2018-09-01 NOTE — Telephone Encounter (Signed)
Attempted to try pt at both numbers listed but was unable to reach pt. Unable to leave a message at either number due to no machine kicking in. Will try to call back later.

## 2018-09-04 MED ORDER — NYSTATIN 100000 UNIT/ML MT SUSP: 5.0000 mL | Freq: Four times a day (QID) | OROMUCOSAL | 0 refills | Status: DC

## 2018-09-04 NOTE — Telephone Encounter (Signed)
Order has been placed for the nystatin Rx. Nothing further needed.

## 2018-09-05 DIAGNOSIS — J449 Chronic obstructive pulmonary disease, unspecified: Secondary | ICD-10-CM | POA: Diagnosis not present

## 2018-09-05 DIAGNOSIS — J441 Chronic obstructive pulmonary disease with (acute) exacerbation: Secondary | ICD-10-CM | POA: Diagnosis not present

## 2018-09-05 DIAGNOSIS — I10 Essential (primary) hypertension: Secondary | ICD-10-CM | POA: Diagnosis not present

## 2018-09-08 ENCOUNTER — Ambulatory Visit: Payer: Medicare Other | Admitting: Adult Health

## 2018-09-11 ENCOUNTER — Ambulatory Visit (INDEPENDENT_AMBULATORY_CARE_PROVIDER_SITE_OTHER): Payer: Medicare Other | Admitting: Adult Health

## 2018-09-11 ENCOUNTER — Encounter: Payer: Self-pay | Admitting: Adult Health

## 2018-09-11 DIAGNOSIS — Z720 Tobacco use: Secondary | ICD-10-CM

## 2018-09-11 DIAGNOSIS — J449 Chronic obstructive pulmonary disease, unspecified: Secondary | ICD-10-CM | POA: Diagnosis not present

## 2018-09-11 MED ORDER — TIOTROPIUM BROMIDE MONOHYDRATE 18 MCG IN CAPS: 18.0000 ug | ORAL_CAPSULE | Freq: Every day | RESPIRATORY_TRACT | 5 refills | Status: DC

## 2018-09-11 MED ORDER — ALBUTEROL SULFATE HFA 108 (90 BASE) MCG/ACT IN AERS: 2.0000 | INHALATION_SPRAY | RESPIRATORY_TRACT | 5 refills | Status: DC | PRN

## 2018-09-11 NOTE — Assessment & Plan Note (Signed)
Very severe COPD - Questionable intolerance/reaction to BREO and /or Spiriva  For now will restart Spiriva . Pt has very severe reduction in lung function making him high risk for decompensation  Will reevalate on return , consider changing to LAMA/LABA ( stiolto ) on return if needed  Most important goal is to quit smoking . Smoking cessation education   Plan  Patient Instructions  Restart Spiriva 1 puff daily . Rinse after use use.  Remain off BREO .  Activity as tolerated.  Work on not smoking .  Follow up with Dr. Vassie Loll  In 2-3 months and As needed

## 2018-09-11 NOTE — Patient Instructions (Addendum)
Restart Spiriva 1 puff daily . Rinse after use use.  Remain off BREO .  Activity as tolerated.  Work on not smoking .  Follow up with Dr. Vassie Loll  In 2-3 months and As needed

## 2018-09-11 NOTE — Progress Notes (Signed)
 @Patient  ID: Daniel Tran, male    DOB: 09/12/1945, 73 y.o.   MRN: [id number]  Chief Complaint  Patient presents with  . Follow-up    COPD     Referring provider: No ref. provider found  HPI: 73 year old male, active smoker (cigars) seen for pulmonary consult December 21, 2017 to establish for COPD  Significant tests/ events reviewed  Spirometry 12/2017-download ratio of 38, FEV1 26%, FVC 52%  CT chest 08/2015 showed 6 mm left lower lobe nodule and scattered pleural-based calcifications   09/11/2018 Follow up : COPD  Patient presents for a follow-up for COPD.  Patient recently had some increased irritation along his throat where he felt like he had a lump and tickle in his throat and also felt like he has some swelling along the lower part of his chin.  He was worried that Breo and Spiriva were causing this.  He did stop these medications.  He also called in and was felt to have a possible case of thrush.  He was called in Mycelex.  Patient says he never saw any white patches in his tongue or throat.  Patient says a his symptoms resolved.  He has had no flare of his breathing off of his Breo and Spiriva.  He does have daily shortness of breath and intermittent cough.  He denies any chest pain orthopnea PND or increased leg swelling. We discussed pulmonary rehab but he declines   Immunization History  Administered Date(s) Administered  . Influenza, High Dose Seasonal PF 06/24/2017, 06/08/2018  . Pneumococcal Conjugate-13 01/28/2018  . Pneumococcal Polysaccharide-23 06/12/2017    Past Medical History:  Diagnosis Date  . COPD (chronic obstructive pulmonary disease) (HCC)   . Hypertension, benign   . Seasonal allergic rhinitis     Tobacco History: Social History   Tobacco Use  Smoking Status Current Every Day Smoker  . Packs/day: 2.00  . Years: 60.00  . Pack years: 120.00  . Types: Cigarettes, Cigars  Smokeless Tobacco Never Used  Tobacco Comment   smokes 3 cigarettes/day  06/08/2018   Ready to quit: No Counseling given: Yes Comment: smokes 3 cigarettes/day 06/08/2018   Outpatient Medications Prior to Visit  Medication Sig Dispense Refill  . ipratropium-albuterol (DUONEB) 0.5-2.5 (3) MG/3ML SOLN Take 3 mLs by nebulization every 4 (four) hours as needed.   2  . albuterol (PROAIR HFA) 108 (90 Base) MCG/ACT inhaler Inhale 2 puffs into the lungs every 4 (four) hours as needed for wheezing or shortness of breath.    . fluticasone furoate-vilanterol (BREO ELLIPTA) 200-25 MCG/INH AEPB Inhale 1 puff into the lungs daily. (Patient not taking: Reported on 09/11/2018) 60 each 5  . nystatin (MYCOSTATIN) 100000 UNIT/ML suspension Take 5 mLs (500,000 Units total) by mouth every 6 (six) hours. (Patient not taking: Reported on 09/11/2018) 140 mL 0  . tiotropium (SPIRIVA) 18 MCG inhalation capsule Place 1 capsule (18 mcg total) into inhaler and inhale daily. (Patient not taking: Reported on 09/11/2018) 30 capsule 6   No facility-administered medications prior to visit.      Review of Systems  Constitutional:   No  weight loss, night sweats,  Fevers, chills,  +fatigue, or  lassitude.  HEENT:   No headaches,  Difficulty swallowing,  Tooth/dental problems, or  Sore throat,                No sneezing, itching, ear ache, nasal congestion, post nasal drip,   CV:  No chest pain,  Orthopnea, PND, swelling in  lower extremities, anasarca, dizziness, palpitations, syncope.   GI  No heartburn, indigestion, abdominal pain, nausea, vomiting, diarrhea, change in bowel habits, loss of appetite, bloody stools.   Resp:   No excess mucus, no productive cough,  No non-productive cough,  No coughing up of blood.  No change in color of mucus.  No wheezing.  No chest wall deformity  Skin: no rash or lesions.  GU: no dysuria, change in color of urine, no urgency or frequency.  No flank pain, no hematuria   MS:  No joint pain or swelling.  No decreased range of motion.  No back  pain.    Physical Exam  BP 124/64 (BP Location: Left Arm, Cuff Size: Normal)   Pulse 90   Ht 6' (1.829 m)   Wt 161 lb 3.2 oz (73.1 kg)   SpO2 95%   BMI 21.86 kg/m   GEN: A/Ox3; pleasant , NAD, elderly   HEENT:  [place]/AT,  EACs-clear, TMs-wnl, NOSE-clear, THROAT-clear, no lesions, no postnasal drip or exudate noted.  No facial swelling noted.  NECK:  Supple w/ fair ROM; no JVD; normal carotid impulses w/o bruits; no thyromegaly or nodules palpated; no lymphadenopathy.    RESP decreased breath sounds in the bases   no accessory muscle use, no dullness to percussion  CARD:  RRR, no m/r/g, no peripheral edema, pulses intact, no cyanosis or clubbing.  GI:   Soft & nt; nml bowel sounds; no organomegaly or masses detected.   Musco: Warm bil, no deformities or joint swelling noted.   Neuro: alert, no focal deficits noted.    Skin: Warm, no lesions or rashes    Lab Results:  CBC No results found for: WBC, RBC, HGB, HCT, PLT, MCV, MCH, MCHC, RDW, LYMPHSABS, MONOABS, EOSABS, BASOSABS  BMET No results found for: NA, K, CL, CO2, GLUCOSE, BUN, CREATININE, CALCIUM, GFRNONAA, GFRAA  BNP No results found for: BNP  ProBNP No results found for: PROBNP  Imaging: No results found.    No flowsheet data found.  No results found for: NITRICOXIDE      Assessment & Plan:   COPD (chronic obstructive pulmonary disease) (HCC) Very severe COPD - Questionable intolerance/reaction to BREO and /or Spiriva  For now will restart Spiriva . Pt has very severe reduction in lung function making him high risk for decompensation  Will reevalate on return , consider changing to LAMA/LABA ( stiolto ) on return if needed  Most important goal is to quit smoking . Smoking cessation education   Plan  Patient Instructions  Restart Spiriva 1 puff daily . Rinse after use use.  Remain off BREO .  Activity as tolerated.  Work on not smoking .  Follow up with Dr. Vassie Loll  In 2-3 months and As needed         Tobacco abuse Smoking cessation      Rubye Oaks, NP 09/11/2018

## 2018-09-11 NOTE — Assessment & Plan Note (Signed)
Smoking cessation  

## 2018-09-19 DIAGNOSIS — L219 Seborrheic dermatitis, unspecified: Secondary | ICD-10-CM | POA: Diagnosis not present

## 2018-09-19 DIAGNOSIS — Z6822 Body mass index (BMI) 22.0-22.9, adult: Secondary | ICD-10-CM | POA: Diagnosis not present

## 2018-10-06 DIAGNOSIS — I10 Essential (primary) hypertension: Secondary | ICD-10-CM | POA: Diagnosis not present

## 2018-10-06 DIAGNOSIS — J449 Chronic obstructive pulmonary disease, unspecified: Secondary | ICD-10-CM | POA: Diagnosis not present

## 2018-10-06 DIAGNOSIS — J441 Chronic obstructive pulmonary disease with (acute) exacerbation: Secondary | ICD-10-CM | POA: Diagnosis not present

## 2018-10-23 DIAGNOSIS — H401123 Primary open-angle glaucoma, left eye, severe stage: Secondary | ICD-10-CM | POA: Diagnosis not present

## 2018-11-03 DIAGNOSIS — I10 Essential (primary) hypertension: Secondary | ICD-10-CM | POA: Diagnosis not present

## 2018-11-03 DIAGNOSIS — J449 Chronic obstructive pulmonary disease, unspecified: Secondary | ICD-10-CM | POA: Diagnosis not present

## 2018-11-03 DIAGNOSIS — J441 Chronic obstructive pulmonary disease with (acute) exacerbation: Secondary | ICD-10-CM | POA: Diagnosis not present

## 2018-11-06 DIAGNOSIS — H401123 Primary open-angle glaucoma, left eye, severe stage: Secondary | ICD-10-CM | POA: Diagnosis not present

## 2018-11-13 ENCOUNTER — Ambulatory Visit: Payer: Medicare Other | Admitting: Pulmonary Disease

## 2018-11-13 DIAGNOSIS — H401123 Primary open-angle glaucoma, left eye, severe stage: Secondary | ICD-10-CM | POA: Diagnosis not present

## 2018-11-16 ENCOUNTER — Ambulatory Visit (INDEPENDENT_AMBULATORY_CARE_PROVIDER_SITE_OTHER): Payer: Medicare Other | Admitting: Pulmonary Disease

## 2018-11-16 ENCOUNTER — Encounter: Payer: Self-pay | Admitting: Pulmonary Disease

## 2018-11-16 ENCOUNTER — Other Ambulatory Visit: Payer: Self-pay

## 2018-11-16 DIAGNOSIS — Z720 Tobacco use: Secondary | ICD-10-CM | POA: Diagnosis not present

## 2018-11-16 DIAGNOSIS — J432 Centrilobular emphysema: Secondary | ICD-10-CM | POA: Diagnosis not present

## 2018-11-16 MED ORDER — TIOTROPIUM BROMIDE-OLODATEROL 2.5-2.5 MCG/ACT IN AERS: 2.0000 | INHALATION_SPRAY | Freq: Every day | RESPIRATORY_TRACT | 0 refills | Status: DC

## 2018-11-16 NOTE — Patient Instructions (Signed)
Sample of Stiolto -2 puffs daily, take instead of Spiriva. Call me back for prescription if this works and is covered by [employer]

## 2018-11-16 NOTE — Assessment & Plan Note (Signed)
He was taken off Breo due to thrush. Sample of Stiolto -2 puffs daily, take instead of Spiriva. Call me back for prescription if this works and is covered by insurance  Does not want pulmonary rehab

## 2018-11-16 NOTE — Progress Notes (Signed)
   Subjective:    Patient ID: Daniel Tran, male    DOB: September 09, 1945, 73 y.o.   MRN: [id number]  HPI  73 year old smoker for follow-up of severe COPD. On his last visit he was changed from nebs to [facility] and Spiriva   Chief Complaint  Patient presents with  . Follow-up    2 month follow up for COPD. States his breathing has the same since the last visit.     Taken off breo 09/2018 due to thrush.  He is maintained only on Spiriva. Has a lot of energy per his family and keeps going, does not rest. He denies dyspnea, has "congestion" but once he gets the phlegm up he is good to go. He continues to smoke occasionally.    Significant tests/ events reviewed  Spirometry 12/2017-download ratio of 38, FEV1 26%, FVC 52%  CT chest 08/2015 showed 6 mm left lower lobe nodule and scattered pleural-based calcifications >> resolved 01/2018 CT chest 01/2018  resolution of nodule  Review of Systems neg for any significant sore throat, dysphagia, itching, sneezing, nasal congestion or excess/ purulent secretions, fever, chills, sweats, unintended wt loss, pleuritic or exertional cp, hempoptysis, orthopnea pnd or change in chronic leg swelling. Also denies presyncope, palpitations, heartburn, abdominal pain, nausea, vomiting, diarrhea or change in bowel or urinary habits, dysuria,hematuria, rash, arthralgias, visual complaints, headache, numbness weakness or ataxia.     Objective:   Physical Exam  Gen. Pleasant, thin, elderly,well-nourished, in no distress ENT - no thrush, no pallor/icterus,no post nasal drip Neck: No JVD, no thyromegaly, no carotid bruits Lungs: no use of accessory muscles, no dullness to percussion, clear without rales or rhonchi  Cardiovascular: Rhythm regular, heart sounds  normal, no murmurs or gallops, no peripheral edema Musculoskeletal: No deformities, no cyanosis or clubbing        Assessment & Plan:

## 2018-11-16 NOTE — Addendum Note (Signed)
Addended by: Sylvester Harder on: 11/16/2018 11:39 AM   Modules accepted: Orders

## 2018-11-16 NOTE — Assessment & Plan Note (Signed)
Smoking cessation again emphasized 

## 2018-12-05 DIAGNOSIS — I10 Essential (primary) hypertension: Secondary | ICD-10-CM | POA: Diagnosis not present

## 2018-12-05 DIAGNOSIS — J449 Chronic obstructive pulmonary disease, unspecified: Secondary | ICD-10-CM | POA: Diagnosis not present

## 2018-12-05 DIAGNOSIS — J441 Chronic obstructive pulmonary disease with (acute) exacerbation: Secondary | ICD-10-CM | POA: Diagnosis not present

## 2018-12-22 ENCOUNTER — Telehealth: Payer: Self-pay | Admitting: Pulmonary Disease

## 2018-12-22 MED ORDER — TIOTROPIUM BROMIDE-OLODATEROL 2.5-2.5 MCG/ACT IN AERS: 2.0000 | INHALATION_SPRAY | Freq: Every day | RESPIRATORY_TRACT | 0 refills | Status: DC

## 2018-12-22 NOTE — Telephone Encounter (Signed)
Patient returned phone call, wanting to know if he could get a 3 month supply. I told him I would try and if pharmacy could do it, we would. If not just get it every month. Also if he needed [place] to call us.   Nothing further needed at this time.

## 2018-12-22 NOTE — Telephone Encounter (Signed)
ATC patient, he isn't home, LMTCB.

## 2019-01-04 DIAGNOSIS — J449 Chronic obstructive pulmonary disease, unspecified: Secondary | ICD-10-CM | POA: Diagnosis not present

## 2019-01-04 DIAGNOSIS — J441 Chronic obstructive pulmonary disease with (acute) exacerbation: Secondary | ICD-10-CM | POA: Diagnosis not present

## 2019-01-04 DIAGNOSIS — I10 Essential (primary) hypertension: Secondary | ICD-10-CM | POA: Diagnosis not present

## 2019-01-22 DIAGNOSIS — H02115 Cicatricial ectropion of left lower eyelid: Secondary | ICD-10-CM | POA: Diagnosis not present

## 2019-01-26 ENCOUNTER — Other Ambulatory Visit: Payer: Self-pay | Admitting: Adult Health

## 2019-01-26 MED ORDER — ALBUTEROL SULFATE HFA 108 (90 BASE) MCG/ACT IN AERS: 2.0000 | INHALATION_SPRAY | RESPIRATORY_TRACT | 1 refills | Status: DC | PRN

## 2019-01-26 NOTE — Telephone Encounter (Signed)
Received faxed request from [facility] for new Rx request Rx sent Nothing further needed

## 2019-02-02 DIAGNOSIS — J441 Chronic obstructive pulmonary disease with (acute) exacerbation: Secondary | ICD-10-CM | POA: Diagnosis not present

## 2019-02-02 DIAGNOSIS — J449 Chronic obstructive pulmonary disease, unspecified: Secondary | ICD-10-CM | POA: Diagnosis not present

## 2019-02-02 DIAGNOSIS — I10 Essential (primary) hypertension: Secondary | ICD-10-CM | POA: Diagnosis not present

## 2019-02-06 DIAGNOSIS — H02115 Cicatricial ectropion of left lower eyelid: Secondary | ICD-10-CM | POA: Diagnosis not present

## 2019-02-06 DIAGNOSIS — H02112 Cicatricial ectropion of right lower eyelid: Secondary | ICD-10-CM | POA: Diagnosis not present

## 2019-02-06 DIAGNOSIS — H01132 Eczematous dermatitis of right lower eyelid: Secondary | ICD-10-CM | POA: Diagnosis not present

## 2019-02-06 DIAGNOSIS — H54415A Blindness right eye category 5, normal vision left eye: Secondary | ICD-10-CM | POA: Diagnosis not present

## 2019-02-06 DIAGNOSIS — H04563 Stenosis of bilateral lacrimal punctum: Secondary | ICD-10-CM | POA: Diagnosis not present

## 2019-02-08 DIAGNOSIS — Z139 Encounter for screening, unspecified: Secondary | ICD-10-CM | POA: Diagnosis not present

## 2019-02-08 DIAGNOSIS — Z0000 Encounter for general adult medical examination without abnormal findings: Secondary | ICD-10-CM | POA: Diagnosis not present

## 2019-02-08 DIAGNOSIS — K1329 Other disturbances of oral epithelium, including tongue: Secondary | ICD-10-CM | POA: Diagnosis not present

## 2019-02-08 DIAGNOSIS — H6591 Unspecified nonsuppurative otitis media, right ear: Secondary | ICD-10-CM | POA: Diagnosis not present

## 2019-02-08 DIAGNOSIS — Z6822 Body mass index (BMI) 22.0-22.9, adult: Secondary | ICD-10-CM | POA: Diagnosis not present

## 2019-02-08 DIAGNOSIS — Z7189 Other specified counseling: Secondary | ICD-10-CM | POA: Diagnosis not present

## 2019-03-06 DIAGNOSIS — S00412A Abrasion of left ear, initial encounter: Secondary | ICD-10-CM | POA: Diagnosis not present

## 2019-03-06 DIAGNOSIS — J449 Chronic obstructive pulmonary disease, unspecified: Secondary | ICD-10-CM | POA: Diagnosis not present

## 2019-03-06 DIAGNOSIS — H938X1 Other specified disorders of right ear: Secondary | ICD-10-CM | POA: Diagnosis not present

## 2019-03-06 DIAGNOSIS — J441 Chronic obstructive pulmonary disease with (acute) exacerbation: Secondary | ICD-10-CM | POA: Diagnosis not present

## 2019-03-06 DIAGNOSIS — H9201 Otalgia, right ear: Secondary | ICD-10-CM | POA: Diagnosis not present

## 2019-03-06 DIAGNOSIS — I10 Essential (primary) hypertension: Secondary | ICD-10-CM | POA: Diagnosis not present

## 2019-03-06 DIAGNOSIS — L814 Other melanin hyperpigmentation: Secondary | ICD-10-CM | POA: Diagnosis not present

## 2019-03-09 ENCOUNTER — Telehealth: Payer: Self-pay | Admitting: Pulmonary Disease

## 2019-03-09 ENCOUNTER — Other Ambulatory Visit: Payer: Self-pay | Admitting: Pulmonary Disease

## 2019-03-09 MED ORDER — STIOLTO RESPIMAT 2.5-2.5 MCG/ACT IN AERS: 2.0000 | INHALATION_SPRAY | Freq: Every day | RESPIRATORY_TRACT | 3 refills | Status: DC

## 2019-03-09 NOTE — Telephone Encounter (Signed)
Needs refill on stiolto.  Confirmed he is not using spiriva and d/c this from med list.  Confirmed listed pharmacy is correct.  Sent refill for 3 months supply with 3 refills.

## 2019-03-13 DIAGNOSIS — H04563 Stenosis of bilateral lacrimal punctum: Secondary | ICD-10-CM | POA: Diagnosis not present

## 2019-03-13 DIAGNOSIS — H01135 Eczematous dermatitis of left lower eyelid: Secondary | ICD-10-CM | POA: Diagnosis not present

## 2019-03-13 DIAGNOSIS — H02135 Senile ectropion of left lower eyelid: Secondary | ICD-10-CM | POA: Diagnosis not present

## 2019-03-13 DIAGNOSIS — H02132 Senile ectropion of right lower eyelid: Secondary | ICD-10-CM | POA: Diagnosis not present

## 2019-03-13 DIAGNOSIS — H01132 Eczematous dermatitis of right lower eyelid: Secondary | ICD-10-CM | POA: Diagnosis not present

## 2019-04-02 ENCOUNTER — Ambulatory Visit: Payer: Medicare Other | Admitting: Adult Health

## 2019-04-05 ENCOUNTER — Ambulatory Visit: Payer: Medicare Other | Admitting: Adult Health

## 2019-04-06 DIAGNOSIS — I10 Essential (primary) hypertension: Secondary | ICD-10-CM | POA: Diagnosis not present

## 2019-04-06 DIAGNOSIS — J441 Chronic obstructive pulmonary disease with (acute) exacerbation: Secondary | ICD-10-CM | POA: Diagnosis not present

## 2019-04-06 DIAGNOSIS — J449 Chronic obstructive pulmonary disease, unspecified: Secondary | ICD-10-CM | POA: Diagnosis not present

## 2019-04-08 DIAGNOSIS — H02115 Cicatricial ectropion of left lower eyelid: Secondary | ICD-10-CM | POA: Diagnosis not present

## 2019-04-08 DIAGNOSIS — H02112 Cicatricial ectropion of right lower eyelid: Secondary | ICD-10-CM | POA: Diagnosis not present

## 2019-04-08 DIAGNOSIS — Z1159 Encounter for screening for other viral diseases: Secondary | ICD-10-CM | POA: Diagnosis not present

## 2019-04-08 DIAGNOSIS — H04563 Stenosis of bilateral lacrimal punctum: Secondary | ICD-10-CM | POA: Diagnosis not present

## 2019-04-09 ENCOUNTER — Ambulatory Visit: Payer: Medicare Other | Admitting: Adult Health

## 2019-04-11 DIAGNOSIS — J449 Chronic obstructive pulmonary disease, unspecified: Secondary | ICD-10-CM | POA: Diagnosis not present

## 2019-04-11 DIAGNOSIS — H04563 Stenosis of bilateral lacrimal punctum: Secondary | ICD-10-CM | POA: Diagnosis not present

## 2019-04-11 DIAGNOSIS — I10 Essential (primary) hypertension: Secondary | ICD-10-CM | POA: Diagnosis not present

## 2019-04-11 DIAGNOSIS — H02115 Cicatricial ectropion of left lower eyelid: Secondary | ICD-10-CM | POA: Diagnosis not present

## 2019-04-11 DIAGNOSIS — H02112 Cicatricial ectropion of right lower eyelid: Secondary | ICD-10-CM | POA: Diagnosis not present

## 2019-04-11 DIAGNOSIS — H02106 Unspecified ectropion of left eye, unspecified eyelid: Secondary | ICD-10-CM | POA: Diagnosis not present

## 2019-04-11 HISTORY — PX: EYE SURGERY: SHX253

## 2019-04-30 ENCOUNTER — Other Ambulatory Visit: Payer: Self-pay

## 2019-04-30 ENCOUNTER — Encounter: Payer: Self-pay | Admitting: Adult Health

## 2019-04-30 ENCOUNTER — Ambulatory Visit (INDEPENDENT_AMBULATORY_CARE_PROVIDER_SITE_OTHER): Payer: Medicare Other

## 2019-04-30 ENCOUNTER — Ambulatory Visit (INDEPENDENT_AMBULATORY_CARE_PROVIDER_SITE_OTHER): Payer: Medicare Other | Admitting: Adult Health

## 2019-04-30 VITALS — BP 122/78 | HR 83 | Temp 98.5°F | Ht 72.0 in | Wt 157.4 lb

## 2019-04-30 DIAGNOSIS — J449 Chronic obstructive pulmonary disease, unspecified: Secondary | ICD-10-CM | POA: Diagnosis not present

## 2019-04-30 DIAGNOSIS — F17210 Nicotine dependence, cigarettes, uncomplicated: Secondary | ICD-10-CM

## 2019-04-30 DIAGNOSIS — Z720 Tobacco use: Secondary | ICD-10-CM

## 2019-04-30 NOTE — Assessment & Plan Note (Signed)
Smoking cessation discussed 

## 2019-04-30 NOTE — Patient Instructions (Addendum)
Continue on Stiolto 2 puffs daily, rinse after use Activity as tolerated.  Work on not smoking .  Chest xray today  Get flu shot next month .  Follow up with Dr. Elsworth Soho  In 6 months and As needed

## 2019-04-30 NOTE — Assessment & Plan Note (Addendum)
Compensated on current regimen Smoking cessation encouraged Chest xray today  Will get flu shot next month   Plan  Patient Instructions  Continue on Stiolto 2 puffs daily, rinse after use Activity as tolerated.  Work on not smoking .  Chest xray today  Get flu shot next month .  Follow up with Dr. Elsworth Soho  In 6 months and As needed

## 2019-04-30 NOTE — Progress Notes (Signed)
@Patient  ID: Daniel Tran, male    DOB: [birth year], 73 y.o.   MRN: [id number]  Chief Complaint  Patient presents with  . Follow-up    COPD     Referring provider: Marco Collie, MD  HPI: 73 year old male, active smoker (cigars ) seen for pulmonary consult December 21, 2017 to establish for COPD  Significant tests/ events reviewed  Spirometry 12/2017-download ratio of 38, FEV1 26%, FVC 52%  CT chest 08/2015 showed 6 mm left lower lobe nodule and scattered pleural-based calcifications  CT chest May 2019 interval resolution of left lower lobe pulmonary nodule, resolution of previous right lung bronchiolitis, bilateral emphysema  04/30/2019 Follow up : COPD , smoker  Patient returns for a 36-month follow-up.  Patient has underlying very severe COPD.  He has an active smoker.  We discussed smoking cessation.  He remains on Stiolto daily Really feels the [place] works well for him. Says he gets winded with heavy activity . Says he goes up and down steps well but gets winded at top of steps and has to rest.  Says he is active.  He has a treadmill and stationary bike  machine that he uses most days. Walks 15-92min on treadmill 3 days a week  . Uses the bike 3 days a week.  Has daily cough/congestion. Says once he coughs up thick mucus feels better. Mucus is clear to white . No fever, hemoptysis or edema.   Declines flu shot today , will get later . Patient education given.      No Known Allergies  Immunization History  Administered Date(s) Administered  . Influenza, High Dose Seasonal PF 06/24/2017, 06/08/2018  . Pneumococcal Conjugate-13 01/28/2018  . Pneumococcal Polysaccharide-23 06/12/2017    Past Medical History:  Diagnosis Date  . COPD (chronic obstructive pulmonary disease) ([place])   . Hypertension, benign   . Seasonal allergic rhinitis     Tobacco History: Social History   Tobacco Use  Smoking Status Current Every Day Smoker  . Packs/day: 2.00  . Years: 60.00  . Pack  years: 120.00  . Types: Cigarettes, Cigars  Smokeless Tobacco Never Used  Tobacco Comment   smokes 3 cigarettes/day 06/08/2018   Ready to quit: No Counseling given: Yes Comment: smokes 3 cigarettes/day 06/08/2018   Outpatient Medications Prior to Visit  Medication Sig Dispense Refill  . albuterol (PROAIR HFA) 108 (90 Base) MCG/ACT inhaler Inhale 2 puffs into the lungs every 4 (four) hours as needed for wheezing or shortness of breath. 3 Inhaler 1  . dorzolamide-timolol (COSOPT) 22.3-6.8 MG/ML ophthalmic solution INSTILL 1 DROP INTO LEFT EYE TWICE DAILY    . ipratropium-albuterol (DUONEB) 0.5-2.5 (3) MG/3ML SOLN Take 3 mLs by nebulization every 4 (four) hours as needed.   2  . latanoprost (XALATAN) 0.005 % ophthalmic solution INSTILL 1 DROP INTO LEFT EYE ONCE DAILY IN THE EVENING    . neomycin-polymyxin b-dexamethasone (MAXITROL) 3.5-10000-0.1 OINT APPLY A SMALL AMOUNT IN THE CONJUNCTIVAL SAC OF THE LEFT EYE TWICE DAILY FOR 7 TO 10 DAYS    . Tiotropium Bromide-Olodaterol (STIOLTO RESPIMAT) 2.5-2.5 MCG/ACT AERS Inhale 2 puffs into the lungs daily. 12 g 3   No facility-administered medications prior to visit.      Review of Systems:   Constitutional:   No  weight loss, night sweats,  Fevers, chills, fatigue, or  lassitude.  HEENT:   No headaches,  Difficulty swallowing,  Tooth/dental problems, or  Sore throat,  No sneezing, itching, ear ache, nasal congestion, post nasal drip,   CV:  No chest pain,  Orthopnea, PND, swelling in lower extremities, anasarca, dizziness, palpitations, syncope.   GI  No heartburn, indigestion, abdominal pain, nausea, vomiting, diarrhea, change in bowel habits, loss of appetite, bloody stools.   Resp:    No chest wall deformity  Skin: no rash or lesions.  GU: no dysuria, change in color of urine, no urgency or frequency.  No flank pain, no hematuria   MS:  No joint pain or swelling.  No decreased range of motion.  No back pain.     Physical Exam  BP 122/78 (BP Location: Left Arm, Cuff Size: Normal)   Pulse 83   Temp 98.5 F (36.9 C) (Oral)   Ht 6' (1.829 m)   Wt 157 lb 6.4 oz (71.4 kg)   SpO2 93%   BMI 21.35 kg/m   GEN: A/Ox3; pleasant , NAD, elderly    HEENT:  [place]/AT,  , NOSE-clear, THROAT-clear, no lesions, no postnasal drip or exudate noted.  Chronic right blindness -glaucoma   NECK:  Supple w/ fair ROM; no JVD; normal carotid impulses w/o bruits; no thyromegaly or nodules palpated; no lymphadenopathy.    RESP  Few scattered rhonchi  no accessory muscle use, no dullness to percussion  CARD:  RRR, no m/r/g, no peripheral edema, pulses intact, no cyanosis or clubbing.  GI:   Soft & nt; nml bowel sounds; no organomegaly or masses detected.   Musco: Warm bil, no deformities or joint swelling noted.   Neuro: alert, no focal deficits noted.    Skin: Warm, no lesions or rashes    Lab Results:  CBC No results found for: WBC, RBC, HGB, HCT, PLT, MCV, MCH, MCHC, RDW, LYMPHSABS, MONOABS, EOSABS, BASOSABS  BMET No results found for: NA, K, CL, CO2, GLUCOSE, BUN, CREATININE, CALCIUM, GFRNONAA, GFRAA  BNP No results found for: BNP  ProBNP No results found for: PROBNP  Imaging: No results found.    No flowsheet data found.  No results found for: NITRICOXIDE      Assessment & Plan:   COPD (chronic obstructive pulmonary disease) (HCC) Compensated on current regimen Smoking cessation encouraged Chest xray today  Will get flu shot next month   Plan  Patient Instructions  Continue on Stiolto 2 puffs daily, rinse after use Activity as tolerated.  Work on not smoking .  Chest xray today  Get flu shot next month .  Follow up with Dr. Vassie LollAlva  In 6 months and As needed         Tobacco abuse Smoking cessation discussed      Rubye Oaksammy Parrett, NP 04/30/2019

## 2019-05-07 DIAGNOSIS — J449 Chronic obstructive pulmonary disease, unspecified: Secondary | ICD-10-CM | POA: Diagnosis not present

## 2019-05-07 DIAGNOSIS — J441 Chronic obstructive pulmonary disease with (acute) exacerbation: Secondary | ICD-10-CM | POA: Diagnosis not present

## 2019-05-07 DIAGNOSIS — I10 Essential (primary) hypertension: Secondary | ICD-10-CM | POA: Diagnosis not present

## 2019-05-10 DIAGNOSIS — H401123 Primary open-angle glaucoma, left eye, severe stage: Secondary | ICD-10-CM | POA: Diagnosis not present

## 2019-06-06 DIAGNOSIS — J449 Chronic obstructive pulmonary disease, unspecified: Secondary | ICD-10-CM | POA: Diagnosis not present

## 2019-06-06 DIAGNOSIS — J441 Chronic obstructive pulmonary disease with (acute) exacerbation: Secondary | ICD-10-CM | POA: Diagnosis not present

## 2019-06-06 DIAGNOSIS — I10 Essential (primary) hypertension: Secondary | ICD-10-CM | POA: Diagnosis not present

## 2019-06-06 DIAGNOSIS — H1012 Acute atopic conjunctivitis, left eye: Secondary | ICD-10-CM | POA: Diagnosis not present

## 2019-06-29 DIAGNOSIS — H1012 Acute atopic conjunctivitis, left eye: Secondary | ICD-10-CM | POA: Diagnosis not present

## 2019-07-06 DIAGNOSIS — I10 Essential (primary) hypertension: Secondary | ICD-10-CM | POA: Diagnosis not present

## 2019-07-06 DIAGNOSIS — J441 Chronic obstructive pulmonary disease with (acute) exacerbation: Secondary | ICD-10-CM | POA: Diagnosis not present

## 2019-07-06 DIAGNOSIS — J449 Chronic obstructive pulmonary disease, unspecified: Secondary | ICD-10-CM | POA: Diagnosis not present

## 2019-07-13 DIAGNOSIS — H401122 Primary open-angle glaucoma, left eye, moderate stage: Secondary | ICD-10-CM | POA: Diagnosis not present

## 2019-08-06 DIAGNOSIS — J441 Chronic obstructive pulmonary disease with (acute) exacerbation: Secondary | ICD-10-CM | POA: Diagnosis not present

## 2019-08-06 DIAGNOSIS — J449 Chronic obstructive pulmonary disease, unspecified: Secondary | ICD-10-CM | POA: Diagnosis not present

## 2019-08-06 DIAGNOSIS — I10 Essential (primary) hypertension: Secondary | ICD-10-CM | POA: Diagnosis not present

## 2019-08-09 DIAGNOSIS — J449 Chronic obstructive pulmonary disease, unspecified: Secondary | ICD-10-CM | POA: Diagnosis not present

## 2019-08-09 DIAGNOSIS — L309 Dermatitis, unspecified: Secondary | ICD-10-CM | POA: Diagnosis not present

## 2019-08-09 DIAGNOSIS — Z6822 Body mass index (BMI) 22.0-22.9, adult: Secondary | ICD-10-CM | POA: Diagnosis not present

## 2019-08-09 DIAGNOSIS — S39012A Strain of muscle, fascia and tendon of lower back, initial encounter: Secondary | ICD-10-CM | POA: Diagnosis not present

## 2019-08-15 DIAGNOSIS — H401113 Primary open-angle glaucoma, right eye, severe stage: Secondary | ICD-10-CM | POA: Diagnosis not present

## 2019-08-15 DIAGNOSIS — H401122 Primary open-angle glaucoma, left eye, moderate stage: Secondary | ICD-10-CM | POA: Diagnosis not present

## 2019-08-19 DIAGNOSIS — M545 Low back pain: Secondary | ICD-10-CM | POA: Diagnosis not present

## 2019-08-19 DIAGNOSIS — M5416 Radiculopathy, lumbar region: Secondary | ICD-10-CM | POA: Diagnosis not present

## 2019-08-21 DIAGNOSIS — J449 Chronic obstructive pulmonary disease, unspecified: Secondary | ICD-10-CM | POA: Diagnosis not present

## 2019-08-24 DIAGNOSIS — H401123 Primary open-angle glaucoma, left eye, severe stage: Secondary | ICD-10-CM | POA: Diagnosis not present

## 2019-09-05 DIAGNOSIS — I10 Essential (primary) hypertension: Secondary | ICD-10-CM | POA: Diagnosis not present

## 2019-09-05 DIAGNOSIS — J449 Chronic obstructive pulmonary disease, unspecified: Secondary | ICD-10-CM | POA: Diagnosis not present

## 2019-09-06 DIAGNOSIS — J449 Chronic obstructive pulmonary disease, unspecified: Secondary | ICD-10-CM | POA: Diagnosis not present

## 2019-09-13 DIAGNOSIS — J449 Chronic obstructive pulmonary disease, unspecified: Secondary | ICD-10-CM | POA: Diagnosis not present

## 2019-09-26 DIAGNOSIS — J449 Chronic obstructive pulmonary disease, unspecified: Secondary | ICD-10-CM | POA: Diagnosis not present

## 2019-09-26 DIAGNOSIS — Z20822 Contact with and (suspected) exposure to covid-19: Secondary | ICD-10-CM | POA: Diagnosis not present

## 2019-10-04 DIAGNOSIS — J449 Chronic obstructive pulmonary disease, unspecified: Secondary | ICD-10-CM | POA: Diagnosis not present

## 2019-10-05 DIAGNOSIS — J449 Chronic obstructive pulmonary disease, unspecified: Secondary | ICD-10-CM | POA: Diagnosis not present

## 2019-10-05 DIAGNOSIS — H401123 Primary open-angle glaucoma, left eye, severe stage: Secondary | ICD-10-CM | POA: Diagnosis not present

## 2019-10-05 DIAGNOSIS — I10 Essential (primary) hypertension: Secondary | ICD-10-CM | POA: Diagnosis not present

## 2019-10-14 DIAGNOSIS — J449 Chronic obstructive pulmonary disease, unspecified: Secondary | ICD-10-CM | POA: Diagnosis not present

## 2019-10-19 DIAGNOSIS — H401123 Primary open-angle glaucoma, left eye, severe stage: Secondary | ICD-10-CM | POA: Diagnosis not present

## 2019-10-29 DIAGNOSIS — Z6823 Body mass index (BMI) 23.0-23.9, adult: Secondary | ICD-10-CM | POA: Diagnosis not present

## 2019-10-29 DIAGNOSIS — G8929 Other chronic pain: Secondary | ICD-10-CM | POA: Diagnosis not present

## 2019-10-29 DIAGNOSIS — M549 Dorsalgia, unspecified: Secondary | ICD-10-CM | POA: Diagnosis not present

## 2019-11-01 DIAGNOSIS — J449 Chronic obstructive pulmonary disease, unspecified: Secondary | ICD-10-CM | POA: Diagnosis not present

## 2019-11-04 DIAGNOSIS — I10 Essential (primary) hypertension: Secondary | ICD-10-CM | POA: Diagnosis not present

## 2019-11-04 DIAGNOSIS — J449 Chronic obstructive pulmonary disease, unspecified: Secondary | ICD-10-CM | POA: Diagnosis not present

## 2019-11-11 DIAGNOSIS — J449 Chronic obstructive pulmonary disease, unspecified: Secondary | ICD-10-CM | POA: Diagnosis not present

## 2019-11-12 ENCOUNTER — Other Ambulatory Visit: Payer: Self-pay | Admitting: Adult Health

## 2019-11-30 DIAGNOSIS — H401123 Primary open-angle glaucoma, left eye, severe stage: Secondary | ICD-10-CM | POA: Diagnosis not present

## 2019-12-03 DIAGNOSIS — M549 Dorsalgia, unspecified: Secondary | ICD-10-CM | POA: Diagnosis not present

## 2019-12-03 DIAGNOSIS — F17210 Nicotine dependence, cigarettes, uncomplicated: Secondary | ICD-10-CM | POA: Diagnosis not present

## 2019-12-03 DIAGNOSIS — I10 Essential (primary) hypertension: Secondary | ICD-10-CM | POA: Diagnosis not present

## 2019-12-03 DIAGNOSIS — G8929 Other chronic pain: Secondary | ICD-10-CM | POA: Diagnosis not present

## 2019-12-03 DIAGNOSIS — Z13220 Encounter for screening for lipoid disorders: Secondary | ICD-10-CM | POA: Diagnosis not present

## 2019-12-05 DIAGNOSIS — J449 Chronic obstructive pulmonary disease, unspecified: Secondary | ICD-10-CM | POA: Diagnosis not present

## 2019-12-05 DIAGNOSIS — I10 Essential (primary) hypertension: Secondary | ICD-10-CM | POA: Diagnosis not present

## 2019-12-06 DIAGNOSIS — J441 Chronic obstructive pulmonary disease with (acute) exacerbation: Secondary | ICD-10-CM | POA: Diagnosis not present

## 2019-12-06 DIAGNOSIS — R0602 Shortness of breath: Secondary | ICD-10-CM | POA: Diagnosis not present

## 2019-12-11 DIAGNOSIS — Z136 Encounter for screening for cardiovascular disorders: Secondary | ICD-10-CM | POA: Diagnosis not present

## 2019-12-11 DIAGNOSIS — Z0000 Encounter for general adult medical examination without abnormal findings: Secondary | ICD-10-CM | POA: Diagnosis not present

## 2019-12-11 DIAGNOSIS — Z7189 Other specified counseling: Secondary | ICD-10-CM | POA: Diagnosis not present

## 2019-12-11 DIAGNOSIS — Z7689 Persons encountering health services in other specified circumstances: Secondary | ICD-10-CM | POA: Diagnosis not present

## 2019-12-11 DIAGNOSIS — Z139 Encounter for screening, unspecified: Secondary | ICD-10-CM | POA: Diagnosis not present

## 2019-12-11 DIAGNOSIS — J449 Chronic obstructive pulmonary disease, unspecified: Secondary | ICD-10-CM | POA: Diagnosis not present

## 2019-12-12 DIAGNOSIS — J449 Chronic obstructive pulmonary disease, unspecified: Secondary | ICD-10-CM | POA: Diagnosis not present

## 2019-12-31 DIAGNOSIS — M47816 Spondylosis without myelopathy or radiculopathy, lumbar region: Secondary | ICD-10-CM | POA: Diagnosis not present

## 2019-12-31 DIAGNOSIS — Z1389 Encounter for screening for other disorder: Secondary | ICD-10-CM | POA: Diagnosis not present

## 2019-12-31 DIAGNOSIS — M545 Low back pain: Secondary | ICD-10-CM | POA: Diagnosis not present

## 2019-12-31 DIAGNOSIS — G894 Chronic pain syndrome: Secondary | ICD-10-CM | POA: Diagnosis not present

## 2020-01-04 DIAGNOSIS — M549 Dorsalgia, unspecified: Secondary | ICD-10-CM | POA: Diagnosis not present

## 2020-01-04 DIAGNOSIS — G8929 Other chronic pain: Secondary | ICD-10-CM | POA: Diagnosis not present

## 2020-01-04 DIAGNOSIS — J449 Chronic obstructive pulmonary disease, unspecified: Secondary | ICD-10-CM | POA: Diagnosis not present

## 2020-01-04 DIAGNOSIS — I10 Essential (primary) hypertension: Secondary | ICD-10-CM | POA: Diagnosis not present

## 2020-01-07 DIAGNOSIS — G8929 Other chronic pain: Secondary | ICD-10-CM | POA: Diagnosis not present

## 2020-01-07 DIAGNOSIS — Z6823 Body mass index (BMI) 23.0-23.9, adult: Secondary | ICD-10-CM | POA: Diagnosis not present

## 2020-01-07 DIAGNOSIS — M549 Dorsalgia, unspecified: Secondary | ICD-10-CM | POA: Diagnosis not present

## 2020-01-10 DIAGNOSIS — G8929 Other chronic pain: Secondary | ICD-10-CM | POA: Diagnosis not present

## 2020-01-10 DIAGNOSIS — J449 Chronic obstructive pulmonary disease, unspecified: Secondary | ICD-10-CM | POA: Diagnosis not present

## 2020-01-11 DIAGNOSIS — J449 Chronic obstructive pulmonary disease, unspecified: Secondary | ICD-10-CM | POA: Diagnosis not present

## 2020-01-15 DIAGNOSIS — M545 Low back pain: Secondary | ICD-10-CM | POA: Diagnosis not present

## 2020-01-18 DIAGNOSIS — H04123 Dry eye syndrome of bilateral lacrimal glands: Secondary | ICD-10-CM | POA: Diagnosis not present

## 2020-01-18 DIAGNOSIS — H401123 Primary open-angle glaucoma, left eye, severe stage: Secondary | ICD-10-CM | POA: Diagnosis not present

## 2020-01-24 DIAGNOSIS — J449 Chronic obstructive pulmonary disease, unspecified: Secondary | ICD-10-CM | POA: Diagnosis not present

## 2020-02-04 ENCOUNTER — Other Ambulatory Visit: Payer: Self-pay | Admitting: Pulmonary Disease

## 2020-02-04 DIAGNOSIS — I10 Essential (primary) hypertension: Secondary | ICD-10-CM | POA: Diagnosis not present

## 2020-02-04 DIAGNOSIS — J449 Chronic obstructive pulmonary disease, unspecified: Secondary | ICD-10-CM | POA: Diagnosis not present

## 2020-02-07 ENCOUNTER — Other Ambulatory Visit: Payer: Self-pay | Admitting: Pulmonary Disease

## 2020-02-11 ENCOUNTER — Other Ambulatory Visit: Payer: Self-pay | Admitting: *Deleted

## 2020-02-11 DIAGNOSIS — J449 Chronic obstructive pulmonary disease, unspecified: Secondary | ICD-10-CM | POA: Diagnosis not present

## 2020-02-11 MED ORDER — PROAIR HFA 108 (90 BASE) MCG/ACT IN AERS: INHALATION_SPRAY | RESPIRATORY_TRACT | 1 refills | Status: DC

## 2020-02-21 ENCOUNTER — Ambulatory Visit: Payer: Medicare Other | Admitting: Pulmonary Disease

## 2020-02-22 DIAGNOSIS — H401123 Primary open-angle glaucoma, left eye, severe stage: Secondary | ICD-10-CM | POA: Diagnosis not present

## 2020-02-27 IMAGING — CT CT CHEST W/O CM
2 of 3 series · 15 of 36 positions shown, 18 images · non-contrast
Comparison: Chest CTs dated 08/17/2015 and 09/02/2012.

CLINICAL DATA: Follow-up lung nodule, 6 mm, LEFT lower lobe.

EXAM:
CT CHEST WITHOUT CONTRAST
TECHNIQUE: Multidetector CT imaging of the chest was performed following the
standard protocol without IV contrast.

[Series 2: thorax · axial · 0.70mm/px · z∈[-313,-49]mm · 12 of 156 slices shown, 15 images]
[im 12/156  mediastinal]
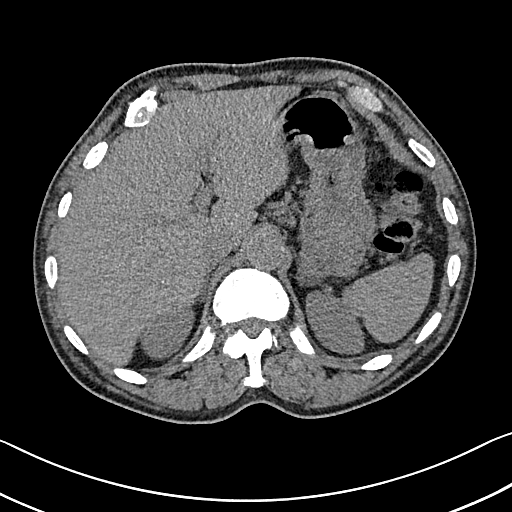
[im 12/156  lung]
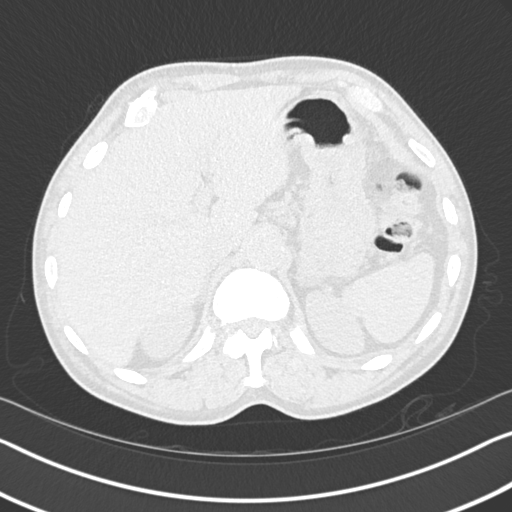
[im 23/156  lung]
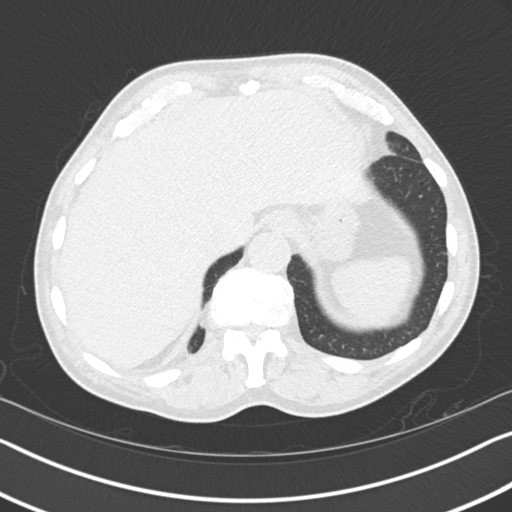
[im 35/156  lung]
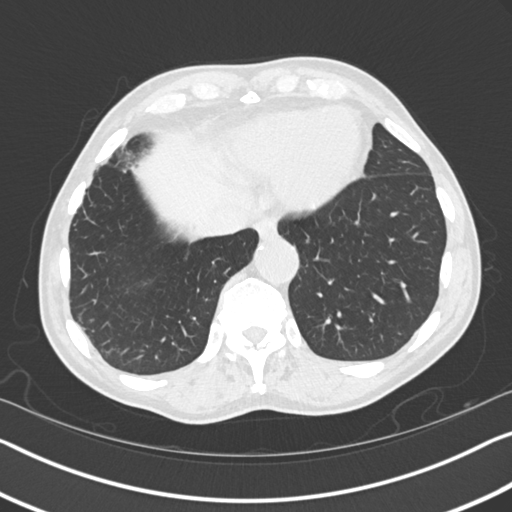
[im 46/156  lung]
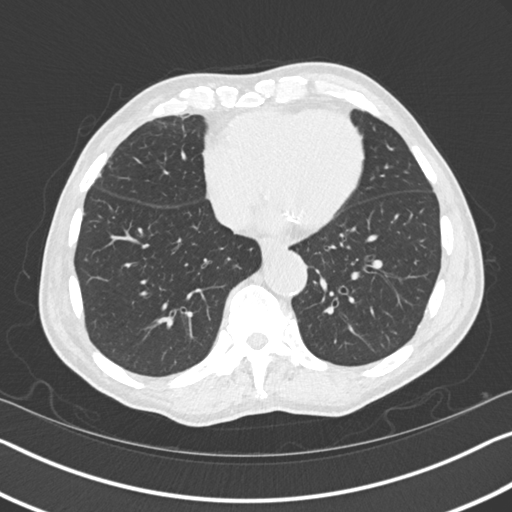
[im 58/156  mediastinal]
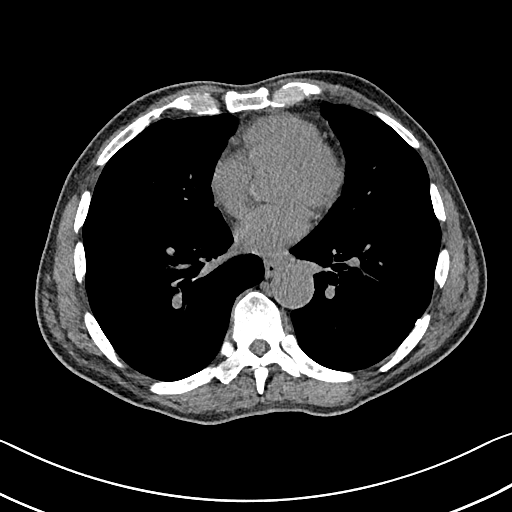
[im 58/156  lung]
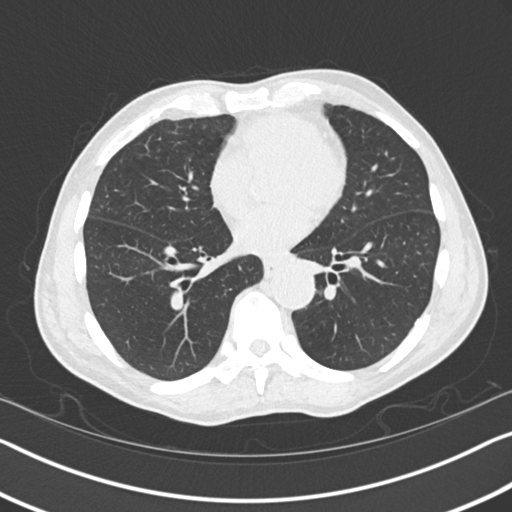
[im 69/156  lung]
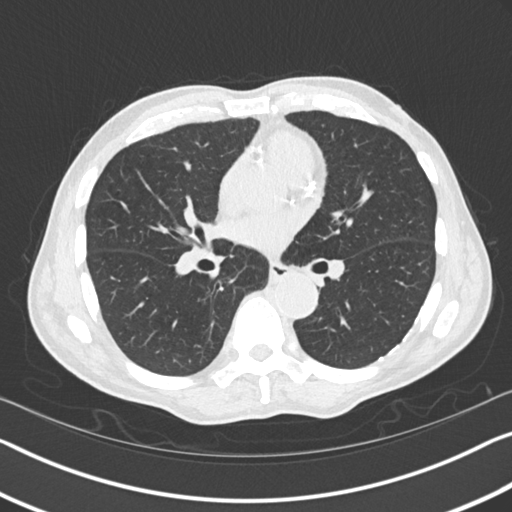
[im 87/156  lung]
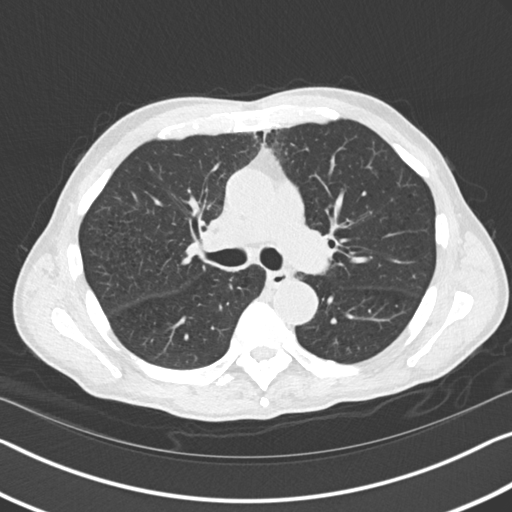
[im 98/156  lung]
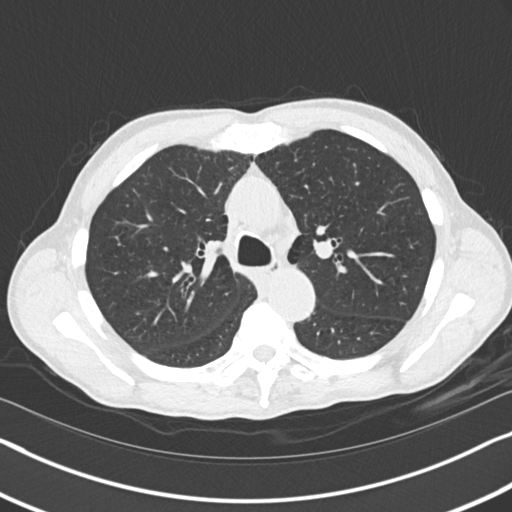
[im 110/156  mediastinal]
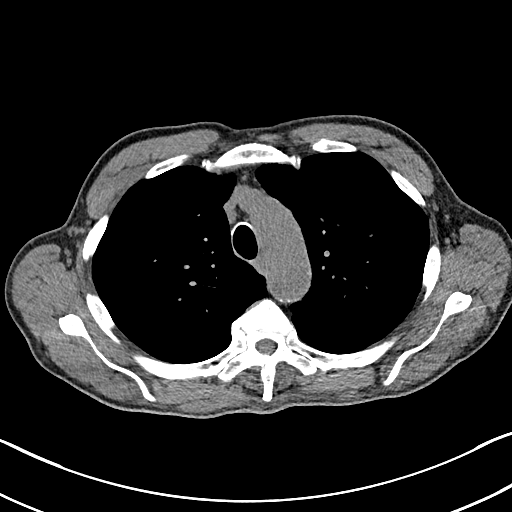
[im 110/156  lung]
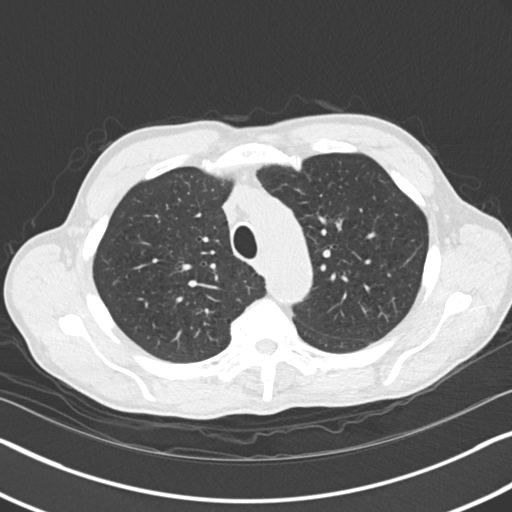
[im 121/156  lung]
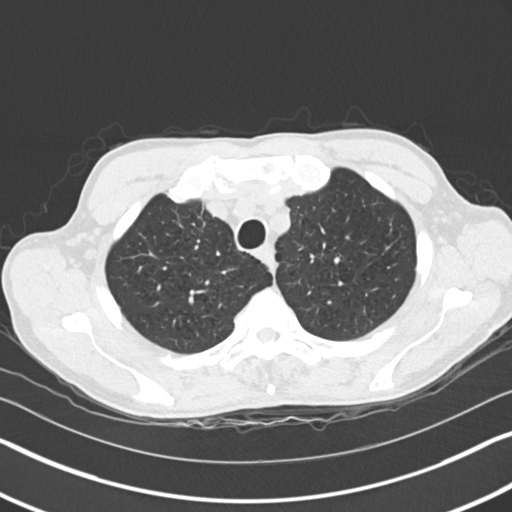
[im 133/156  lung]
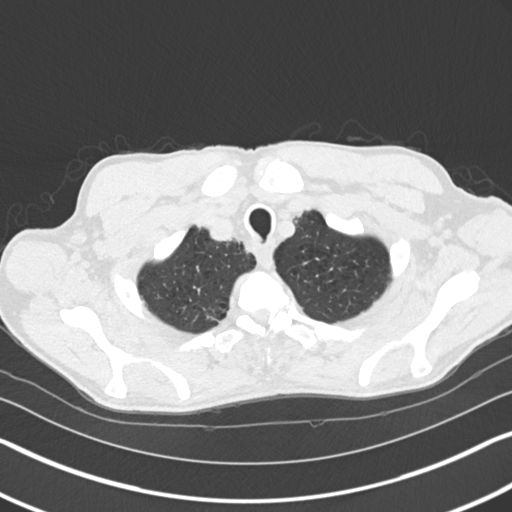
[im 144/156  lung]
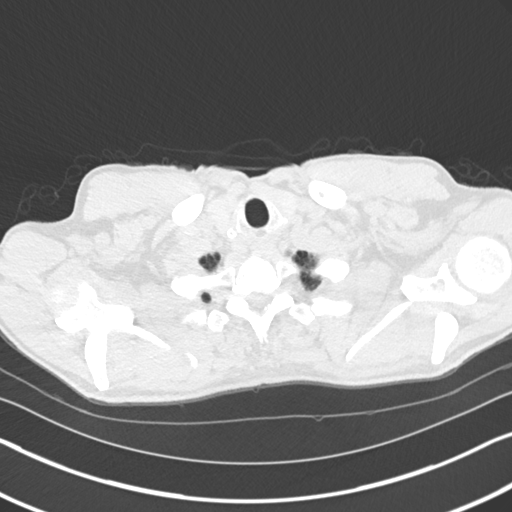

[Series 5: coronal · coronal · 0.64mm/px · 3 of 117 slices shown]
[im 24/117  lung]
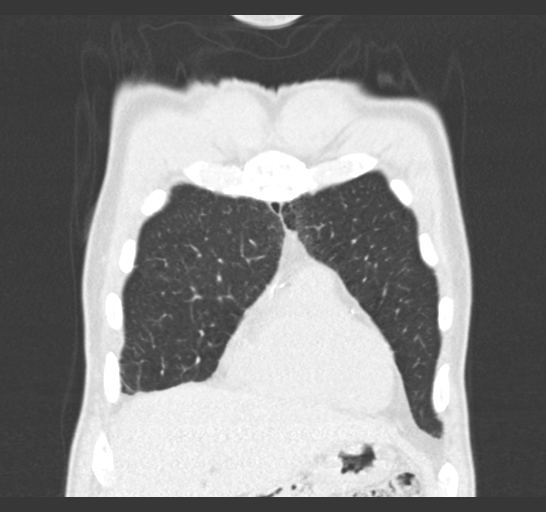
[im 47/117  lung]
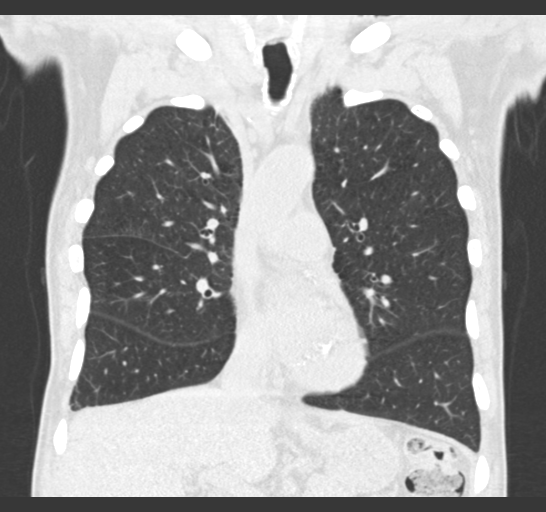
[im 70/117  lung]
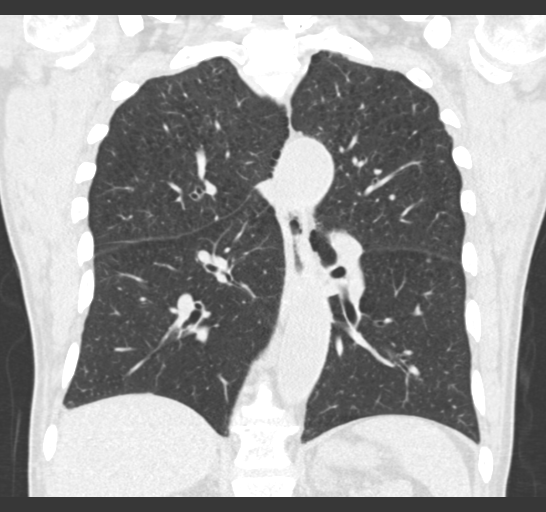

[15 of 36 positions shown; findings below may reference images not displayed]

FINDINGS: Cardiovascular: Heart size is normal. No pericardial effusion. No
thoracic aortic aneurysm. Scattered mild aortic atherosclerosis.

Mediastinum/Nodes: No mass or enlarged lymph nodes within the
mediastinum or perihilar regions. Esophagus appears normal. Trachea
and central bronchi are unremarkable.

Lungs/Pleura: The previously described 6 mm nodular opacity in the
LEFT lower lobe is no longer seen, resolved in the interval
indicating benign etiology, presumably resolved nodular atelectasis
or bronchiolitis.

The previously described bronchiolitis involving the RIGHT lung is
also resolved. The previously described pleural based nodule within
the RIGHT upper lobe is not significantly changed compared to the
previous studies suggesting benignity.

No new lung findings. No pleural effusion or pneumothorax. Stable
emphysematous changes within the upper lobes, mild to moderate in
degree.

Upper Abdomen: No acute findings.

Musculoskeletal: No acute or suspicious osseous findings. Mild
scoliosis of the thoracic spine. Degenerative spurring within the
lower cervical spine, incompletely imaged.
IMPRESSION: 1. Interval resolution of the LEFT lower lobe pulmonary nodule,
confirming benign etiology, presumably resolved nodular atelectasis
or bronchiolitis. No additional follow-up imaging is necessary.
2. Also, interval resolution of the previous RIGHT lung
bronchiolitis.
3. No acute findings.
4. Bilateral upper lobe emphysematous changes, mild to moderate in
degree.
5. Mild aortic atherosclerosis.

Aortic Atherosclerosis (SPVHN-BX4.4) and Emphysema (SPVHN-WGR.Z).

## 2020-03-05 DIAGNOSIS — I10 Essential (primary) hypertension: Secondary | ICD-10-CM | POA: Diagnosis not present

## 2020-03-05 DIAGNOSIS — J449 Chronic obstructive pulmonary disease, unspecified: Secondary | ICD-10-CM | POA: Diagnosis not present

## 2020-03-11 DIAGNOSIS — R42 Dizziness and giddiness: Secondary | ICD-10-CM | POA: Diagnosis not present

## 2020-03-11 DIAGNOSIS — H6590 Unspecified nonsuppurative otitis media, unspecified ear: Secondary | ICD-10-CM | POA: Diagnosis not present

## 2020-03-11 DIAGNOSIS — E860 Dehydration: Secondary | ICD-10-CM | POA: Diagnosis not present

## 2020-03-11 DIAGNOSIS — Z6822 Body mass index (BMI) 22.0-22.9, adult: Secondary | ICD-10-CM | POA: Diagnosis not present

## 2020-03-12 DIAGNOSIS — J449 Chronic obstructive pulmonary disease, unspecified: Secondary | ICD-10-CM | POA: Diagnosis not present

## 2020-04-06 DIAGNOSIS — I10 Essential (primary) hypertension: Secondary | ICD-10-CM | POA: Diagnosis not present

## 2020-04-06 DIAGNOSIS — M549 Dorsalgia, unspecified: Secondary | ICD-10-CM | POA: Diagnosis not present

## 2020-04-06 DIAGNOSIS — J449 Chronic obstructive pulmonary disease, unspecified: Secondary | ICD-10-CM | POA: Diagnosis not present

## 2020-04-12 DIAGNOSIS — J449 Chronic obstructive pulmonary disease, unspecified: Secondary | ICD-10-CM | POA: Diagnosis not present

## 2020-04-17 DIAGNOSIS — J449 Chronic obstructive pulmonary disease, unspecified: Secondary | ICD-10-CM | POA: Diagnosis not present

## 2020-05-06 DIAGNOSIS — I10 Essential (primary) hypertension: Secondary | ICD-10-CM | POA: Diagnosis not present

## 2020-05-06 DIAGNOSIS — J449 Chronic obstructive pulmonary disease, unspecified: Secondary | ICD-10-CM | POA: Diagnosis not present

## 2020-05-07 DIAGNOSIS — J449 Chronic obstructive pulmonary disease, unspecified: Secondary | ICD-10-CM | POA: Diagnosis not present

## 2020-05-07 DIAGNOSIS — I10 Essential (primary) hypertension: Secondary | ICD-10-CM | POA: Diagnosis not present

## 2020-05-13 DIAGNOSIS — J449 Chronic obstructive pulmonary disease, unspecified: Secondary | ICD-10-CM | POA: Diagnosis not present

## 2020-06-06 DIAGNOSIS — I10 Essential (primary) hypertension: Secondary | ICD-10-CM | POA: Diagnosis not present

## 2020-06-06 DIAGNOSIS — J449 Chronic obstructive pulmonary disease, unspecified: Secondary | ICD-10-CM | POA: Diagnosis not present

## 2020-06-12 DIAGNOSIS — J449 Chronic obstructive pulmonary disease, unspecified: Secondary | ICD-10-CM | POA: Diagnosis not present

## 2020-07-01 ENCOUNTER — Other Ambulatory Visit: Payer: Self-pay | Admitting: Pulmonary Disease

## 2020-07-04 DIAGNOSIS — H401123 Primary open-angle glaucoma, left eye, severe stage: Secondary | ICD-10-CM | POA: Diagnosis not present

## 2020-07-07 DIAGNOSIS — J449 Chronic obstructive pulmonary disease, unspecified: Secondary | ICD-10-CM | POA: Diagnosis not present

## 2020-07-07 DIAGNOSIS — M549 Dorsalgia, unspecified: Secondary | ICD-10-CM | POA: Diagnosis not present

## 2020-07-07 DIAGNOSIS — I10 Essential (primary) hypertension: Secondary | ICD-10-CM | POA: Diagnosis not present

## 2020-07-13 DIAGNOSIS — J449 Chronic obstructive pulmonary disease, unspecified: Secondary | ICD-10-CM | POA: Diagnosis not present

## 2020-07-27 DIAGNOSIS — R1031 Right lower quadrant pain: Secondary | ICD-10-CM | POA: Diagnosis not present

## 2020-07-27 DIAGNOSIS — R1032 Left lower quadrant pain: Secondary | ICD-10-CM | POA: Diagnosis not present

## 2020-07-27 DIAGNOSIS — R109 Unspecified abdominal pain: Secondary | ICD-10-CM | POA: Diagnosis not present

## 2020-07-28 DIAGNOSIS — G8929 Other chronic pain: Secondary | ICD-10-CM | POA: Diagnosis not present

## 2020-07-28 DIAGNOSIS — M549 Dorsalgia, unspecified: Secondary | ICD-10-CM | POA: Diagnosis not present

## 2020-07-28 DIAGNOSIS — Z6822 Body mass index (BMI) 22.0-22.9, adult: Secondary | ICD-10-CM | POA: Diagnosis not present

## 2020-08-06 DIAGNOSIS — I10 Essential (primary) hypertension: Secondary | ICD-10-CM | POA: Diagnosis not present

## 2020-08-06 DIAGNOSIS — M549 Dorsalgia, unspecified: Secondary | ICD-10-CM | POA: Diagnosis not present

## 2020-08-06 DIAGNOSIS — J449 Chronic obstructive pulmonary disease, unspecified: Secondary | ICD-10-CM | POA: Diagnosis not present

## 2020-08-12 DIAGNOSIS — J449 Chronic obstructive pulmonary disease, unspecified: Secondary | ICD-10-CM | POA: Diagnosis not present

## 2020-08-19 DIAGNOSIS — W19XXXA Unspecified fall, initial encounter: Secondary | ICD-10-CM | POA: Diagnosis not present

## 2020-08-19 DIAGNOSIS — M25511 Pain in right shoulder: Secondary | ICD-10-CM | POA: Diagnosis not present

## 2020-08-19 DIAGNOSIS — Z6822 Body mass index (BMI) 22.0-22.9, adult: Secondary | ICD-10-CM | POA: Diagnosis not present

## 2020-08-19 DIAGNOSIS — M79641 Pain in right hand: Secondary | ICD-10-CM | POA: Diagnosis not present

## 2020-08-20 DIAGNOSIS — M25511 Pain in right shoulder: Secondary | ICD-10-CM | POA: Diagnosis not present

## 2020-08-20 DIAGNOSIS — M79641 Pain in right hand: Secondary | ICD-10-CM | POA: Diagnosis not present

## 2020-08-20 DIAGNOSIS — M79644 Pain in right finger(s): Secondary | ICD-10-CM | POA: Diagnosis not present

## 2020-08-20 DIAGNOSIS — M7989 Other specified soft tissue disorders: Secondary | ICD-10-CM | POA: Diagnosis not present

## 2020-08-26 DIAGNOSIS — J449 Chronic obstructive pulmonary disease, unspecified: Secondary | ICD-10-CM | POA: Diagnosis not present

## 2020-08-26 DIAGNOSIS — M79641 Pain in right hand: Secondary | ICD-10-CM | POA: Diagnosis not present

## 2020-08-26 DIAGNOSIS — M25511 Pain in right shoulder: Secondary | ICD-10-CM | POA: Diagnosis not present

## 2020-08-26 DIAGNOSIS — Z6823 Body mass index (BMI) 23.0-23.9, adult: Secondary | ICD-10-CM | POA: Diagnosis not present

## 2020-08-29 DIAGNOSIS — M25511 Pain in right shoulder: Secondary | ICD-10-CM | POA: Diagnosis not present

## 2020-08-29 DIAGNOSIS — M7989 Other specified soft tissue disorders: Secondary | ICD-10-CM | POA: Diagnosis not present

## 2020-08-29 DIAGNOSIS — S43401A Unspecified sprain of right shoulder joint, initial encounter: Secondary | ICD-10-CM | POA: Diagnosis not present

## 2020-09-06 DIAGNOSIS — J449 Chronic obstructive pulmonary disease, unspecified: Secondary | ICD-10-CM | POA: Diagnosis not present

## 2020-09-06 DIAGNOSIS — I10 Essential (primary) hypertension: Secondary | ICD-10-CM | POA: Diagnosis not present

## 2020-09-12 DIAGNOSIS — J449 Chronic obstructive pulmonary disease, unspecified: Secondary | ICD-10-CM | POA: Diagnosis not present

## 2020-09-26 DIAGNOSIS — M19019 Primary osteoarthritis, unspecified shoulder: Secondary | ICD-10-CM | POA: Diagnosis not present

## 2020-09-26 DIAGNOSIS — Z6822 Body mass index (BMI) 22.0-22.9, adult: Secondary | ICD-10-CM | POA: Diagnosis not present

## 2020-10-02 DIAGNOSIS — M12811 Other specific arthropathies, not elsewhere classified, right shoulder: Secondary | ICD-10-CM | POA: Diagnosis not present

## 2020-10-02 DIAGNOSIS — F17210 Nicotine dependence, cigarettes, uncomplicated: Secondary | ICD-10-CM | POA: Diagnosis not present

## 2020-10-02 DIAGNOSIS — J449 Chronic obstructive pulmonary disease, unspecified: Secondary | ICD-10-CM | POA: Diagnosis not present

## 2020-10-03 DIAGNOSIS — H401123 Primary open-angle glaucoma, left eye, severe stage: Secondary | ICD-10-CM | POA: Diagnosis not present

## 2020-10-07 DIAGNOSIS — I10 Essential (primary) hypertension: Secondary | ICD-10-CM | POA: Diagnosis not present

## 2020-10-07 DIAGNOSIS — M19019 Primary osteoarthritis, unspecified shoulder: Secondary | ICD-10-CM | POA: Diagnosis not present

## 2020-10-07 DIAGNOSIS — J449 Chronic obstructive pulmonary disease, unspecified: Secondary | ICD-10-CM | POA: Diagnosis not present

## 2020-10-13 DIAGNOSIS — J449 Chronic obstructive pulmonary disease, unspecified: Secondary | ICD-10-CM | POA: Diagnosis not present

## 2020-10-14 DIAGNOSIS — Z6822 Body mass index (BMI) 22.0-22.9, adult: Secondary | ICD-10-CM | POA: Diagnosis not present

## 2020-10-14 DIAGNOSIS — E860 Dehydration: Secondary | ICD-10-CM | POA: Diagnosis not present

## 2020-10-14 DIAGNOSIS — R252 Cramp and spasm: Secondary | ICD-10-CM | POA: Diagnosis not present

## 2020-11-04 DIAGNOSIS — J449 Chronic obstructive pulmonary disease, unspecified: Secondary | ICD-10-CM | POA: Diagnosis not present

## 2020-11-04 DIAGNOSIS — M19019 Primary osteoarthritis, unspecified shoulder: Secondary | ICD-10-CM | POA: Diagnosis not present

## 2020-11-04 DIAGNOSIS — I10 Essential (primary) hypertension: Secondary | ICD-10-CM | POA: Diagnosis not present

## 2020-11-10 DIAGNOSIS — J449 Chronic obstructive pulmonary disease, unspecified: Secondary | ICD-10-CM | POA: Diagnosis not present

## 2020-11-11 DIAGNOSIS — R059 Cough, unspecified: Secondary | ICD-10-CM | POA: Diagnosis not present

## 2020-11-11 DIAGNOSIS — Z20822 Contact with and (suspected) exposure to covid-19: Secondary | ICD-10-CM | POA: Diagnosis not present

## 2020-11-11 DIAGNOSIS — J441 Chronic obstructive pulmonary disease with (acute) exacerbation: Secondary | ICD-10-CM | POA: Diagnosis not present

## 2020-11-11 DIAGNOSIS — Z1152 Encounter for screening for COVID-19: Secondary | ICD-10-CM | POA: Diagnosis not present

## 2020-11-11 DIAGNOSIS — J020 Streptococcal pharyngitis: Secondary | ICD-10-CM | POA: Diagnosis not present

## 2020-11-20 DIAGNOSIS — M12811 Other specific arthropathies, not elsewhere classified, right shoulder: Secondary | ICD-10-CM | POA: Diagnosis not present

## 2020-12-05 DIAGNOSIS — J449 Chronic obstructive pulmonary disease, unspecified: Secondary | ICD-10-CM | POA: Diagnosis not present

## 2020-12-05 DIAGNOSIS — I10 Essential (primary) hypertension: Secondary | ICD-10-CM | POA: Diagnosis not present

## 2020-12-11 DIAGNOSIS — J449 Chronic obstructive pulmonary disease, unspecified: Secondary | ICD-10-CM | POA: Diagnosis not present

## 2020-12-25 DIAGNOSIS — J449 Chronic obstructive pulmonary disease, unspecified: Secondary | ICD-10-CM | POA: Diagnosis not present

## 2020-12-25 DIAGNOSIS — I700 Atherosclerosis of aorta: Secondary | ICD-10-CM | POA: Diagnosis not present

## 2020-12-25 DIAGNOSIS — R748 Abnormal levels of other serum enzymes: Secondary | ICD-10-CM | POA: Diagnosis not present

## 2020-12-25 DIAGNOSIS — R1031 Right lower quadrant pain: Secondary | ICD-10-CM | POA: Diagnosis not present

## 2020-12-25 DIAGNOSIS — R0781 Pleurodynia: Secondary | ICD-10-CM | POA: Diagnosis not present

## 2020-12-25 DIAGNOSIS — F17210 Nicotine dependence, cigarettes, uncomplicated: Secondary | ICD-10-CM | POA: Diagnosis not present

## 2020-12-25 DIAGNOSIS — J929 Pleural plaque without asbestos: Secondary | ICD-10-CM | POA: Diagnosis not present

## 2020-12-25 DIAGNOSIS — R109 Unspecified abdominal pain: Secondary | ICD-10-CM | POA: Diagnosis not present

## 2020-12-25 DIAGNOSIS — R079 Chest pain, unspecified: Secondary | ICD-10-CM | POA: Diagnosis not present

## 2020-12-29 DIAGNOSIS — Z139 Encounter for screening, unspecified: Secondary | ICD-10-CM | POA: Diagnosis not present

## 2020-12-29 DIAGNOSIS — Z136 Encounter for screening for cardiovascular disorders: Secondary | ICD-10-CM | POA: Diagnosis not present

## 2020-12-29 DIAGNOSIS — Z0000 Encounter for general adult medical examination without abnormal findings: Secondary | ICD-10-CM | POA: Diagnosis not present

## 2020-12-29 DIAGNOSIS — F17210 Nicotine dependence, cigarettes, uncomplicated: Secondary | ICD-10-CM | POA: Diagnosis not present

## 2021-01-04 DIAGNOSIS — I10 Essential (primary) hypertension: Secondary | ICD-10-CM | POA: Diagnosis not present

## 2021-01-04 DIAGNOSIS — J449 Chronic obstructive pulmonary disease, unspecified: Secondary | ICD-10-CM | POA: Diagnosis not present

## 2021-01-06 ENCOUNTER — Other Ambulatory Visit: Payer: Self-pay | Admitting: Pulmonary Disease

## 2021-01-08 DIAGNOSIS — J441 Chronic obstructive pulmonary disease with (acute) exacerbation: Secondary | ICD-10-CM | POA: Diagnosis not present

## 2021-01-08 DIAGNOSIS — F17210 Nicotine dependence, cigarettes, uncomplicated: Secondary | ICD-10-CM | POA: Diagnosis not present

## 2021-01-08 DIAGNOSIS — R059 Cough, unspecified: Secondary | ICD-10-CM | POA: Diagnosis not present

## 2021-01-10 DIAGNOSIS — J449 Chronic obstructive pulmonary disease, unspecified: Secondary | ICD-10-CM | POA: Diagnosis not present

## 2021-01-28 ENCOUNTER — Other Ambulatory Visit: Payer: Self-pay | Admitting: Pulmonary Disease

## 2021-02-04 DIAGNOSIS — I10 Essential (primary) hypertension: Secondary | ICD-10-CM | POA: Diagnosis not present

## 2021-02-04 DIAGNOSIS — M19019 Primary osteoarthritis, unspecified shoulder: Secondary | ICD-10-CM | POA: Diagnosis not present

## 2021-02-04 DIAGNOSIS — J449 Chronic obstructive pulmonary disease, unspecified: Secondary | ICD-10-CM | POA: Diagnosis not present

## 2021-02-10 DIAGNOSIS — J449 Chronic obstructive pulmonary disease, unspecified: Secondary | ICD-10-CM | POA: Diagnosis not present

## 2021-02-25 DIAGNOSIS — F17210 Nicotine dependence, cigarettes, uncomplicated: Secondary | ICD-10-CM | POA: Diagnosis not present

## 2021-02-25 DIAGNOSIS — J449 Chronic obstructive pulmonary disease, unspecified: Secondary | ICD-10-CM | POA: Diagnosis not present

## 2021-02-25 DIAGNOSIS — M545 Low back pain, unspecified: Secondary | ICD-10-CM | POA: Diagnosis not present

## 2021-02-25 DIAGNOSIS — R109 Unspecified abdominal pain: Secondary | ICD-10-CM | POA: Diagnosis not present

## 2021-03-06 DIAGNOSIS — J449 Chronic obstructive pulmonary disease, unspecified: Secondary | ICD-10-CM | POA: Diagnosis not present

## 2021-03-06 DIAGNOSIS — M19019 Primary osteoarthritis, unspecified shoulder: Secondary | ICD-10-CM | POA: Diagnosis not present

## 2021-03-06 DIAGNOSIS — I10 Essential (primary) hypertension: Secondary | ICD-10-CM | POA: Diagnosis not present

## 2021-03-11 DIAGNOSIS — Z20822 Contact with and (suspected) exposure to covid-19: Secondary | ICD-10-CM | POA: Diagnosis not present

## 2021-03-11 DIAGNOSIS — U071 COVID-19: Secondary | ICD-10-CM | POA: Diagnosis not present

## 2021-03-12 DIAGNOSIS — J449 Chronic obstructive pulmonary disease, unspecified: Secondary | ICD-10-CM | POA: Diagnosis not present

## 2021-03-19 DIAGNOSIS — U071 COVID-19: Secondary | ICD-10-CM | POA: Diagnosis not present

## 2021-03-19 DIAGNOSIS — Z6821 Body mass index (BMI) 21.0-21.9, adult: Secondary | ICD-10-CM | POA: Diagnosis not present

## 2021-03-19 DIAGNOSIS — J441 Chronic obstructive pulmonary disease with (acute) exacerbation: Secondary | ICD-10-CM | POA: Diagnosis not present

## 2021-03-19 DIAGNOSIS — I700 Atherosclerosis of aorta: Secondary | ICD-10-CM | POA: Diagnosis not present

## 2021-03-26 DIAGNOSIS — R0602 Shortness of breath: Secondary | ICD-10-CM | POA: Diagnosis not present

## 2021-03-26 DIAGNOSIS — Z13220 Encounter for screening for lipoid disorders: Secondary | ICD-10-CM | POA: Diagnosis not present

## 2021-03-26 DIAGNOSIS — R062 Wheezing: Secondary | ICD-10-CM | POA: Diagnosis not present

## 2021-03-26 DIAGNOSIS — J441 Chronic obstructive pulmonary disease with (acute) exacerbation: Secondary | ICD-10-CM | POA: Diagnosis not present

## 2021-03-26 DIAGNOSIS — R051 Acute cough: Secondary | ICD-10-CM | POA: Diagnosis not present

## 2021-03-26 DIAGNOSIS — Z79899 Other long term (current) drug therapy: Secondary | ICD-10-CM | POA: Diagnosis not present

## 2021-03-26 DIAGNOSIS — Z6821 Body mass index (BMI) 21.0-21.9, adult: Secondary | ICD-10-CM | POA: Diagnosis not present

## 2021-04-06 DIAGNOSIS — J441 Chronic obstructive pulmonary disease with (acute) exacerbation: Secondary | ICD-10-CM | POA: Diagnosis not present

## 2021-04-06 DIAGNOSIS — I700 Atherosclerosis of aorta: Secondary | ICD-10-CM | POA: Diagnosis not present

## 2021-04-06 DIAGNOSIS — I10 Essential (primary) hypertension: Secondary | ICD-10-CM | POA: Diagnosis not present

## 2021-04-12 DIAGNOSIS — J449 Chronic obstructive pulmonary disease, unspecified: Secondary | ICD-10-CM | POA: Diagnosis not present

## 2021-04-16 DIAGNOSIS — Z23 Encounter for immunization: Secondary | ICD-10-CM | POA: Diagnosis not present

## 2021-04-20 ENCOUNTER — Other Ambulatory Visit: Payer: Self-pay | Admitting: Pulmonary Disease

## 2021-05-07 DIAGNOSIS — J441 Chronic obstructive pulmonary disease with (acute) exacerbation: Secondary | ICD-10-CM | POA: Diagnosis not present

## 2021-05-07 DIAGNOSIS — I10 Essential (primary) hypertension: Secondary | ICD-10-CM | POA: Diagnosis not present

## 2021-05-07 DIAGNOSIS — I700 Atherosclerosis of aorta: Secondary | ICD-10-CM | POA: Diagnosis not present

## 2021-05-13 DIAGNOSIS — J449 Chronic obstructive pulmonary disease, unspecified: Secondary | ICD-10-CM | POA: Diagnosis not present

## 2021-05-22 DIAGNOSIS — H401123 Primary open-angle glaucoma, left eye, severe stage: Secondary | ICD-10-CM | POA: Diagnosis not present

## 2021-05-27 ENCOUNTER — Other Ambulatory Visit: Payer: Self-pay | Admitting: Adult Health

## 2021-05-28 DIAGNOSIS — F17210 Nicotine dependence, cigarettes, uncomplicated: Secondary | ICD-10-CM | POA: Diagnosis not present

## 2021-05-28 DIAGNOSIS — R059 Cough, unspecified: Secondary | ICD-10-CM | POA: Diagnosis not present

## 2021-05-28 DIAGNOSIS — J449 Chronic obstructive pulmonary disease, unspecified: Secondary | ICD-10-CM | POA: Diagnosis not present

## 2021-05-30 ENCOUNTER — Other Ambulatory Visit: Payer: Self-pay | Admitting: Adult Health

## 2021-06-06 DIAGNOSIS — J441 Chronic obstructive pulmonary disease with (acute) exacerbation: Secondary | ICD-10-CM | POA: Diagnosis not present

## 2021-06-06 DIAGNOSIS — I10 Essential (primary) hypertension: Secondary | ICD-10-CM | POA: Diagnosis not present

## 2021-06-10 IMAGING — DX CHEST - 2 VIEW
2 series · 2 of 2 positions shown · non-contrast
Comparison: 01/16/2018

CLINICAL DATA: Follow-up COPD

EXAM:
CHEST - 2 VIEW

[chest pa]
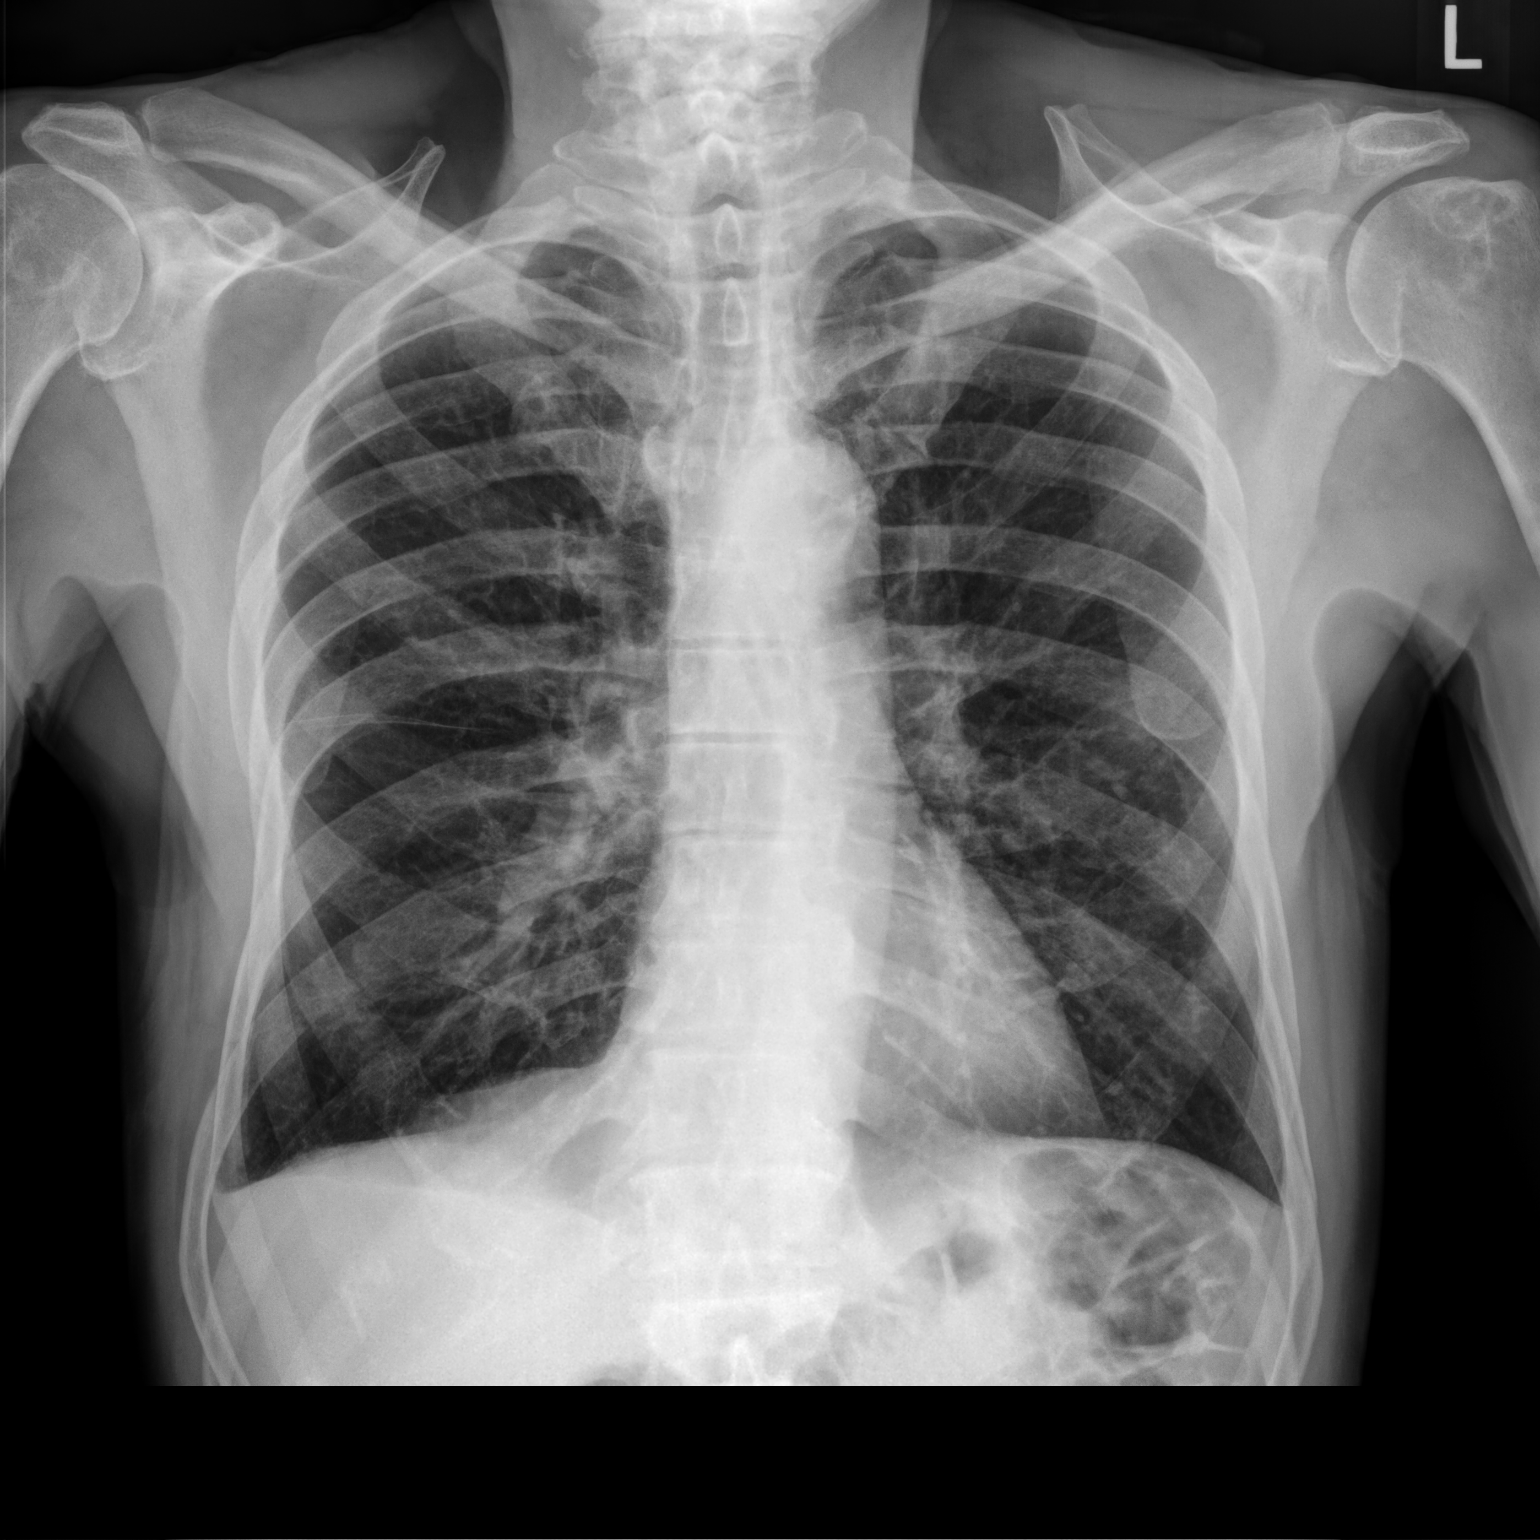

[chest lat]
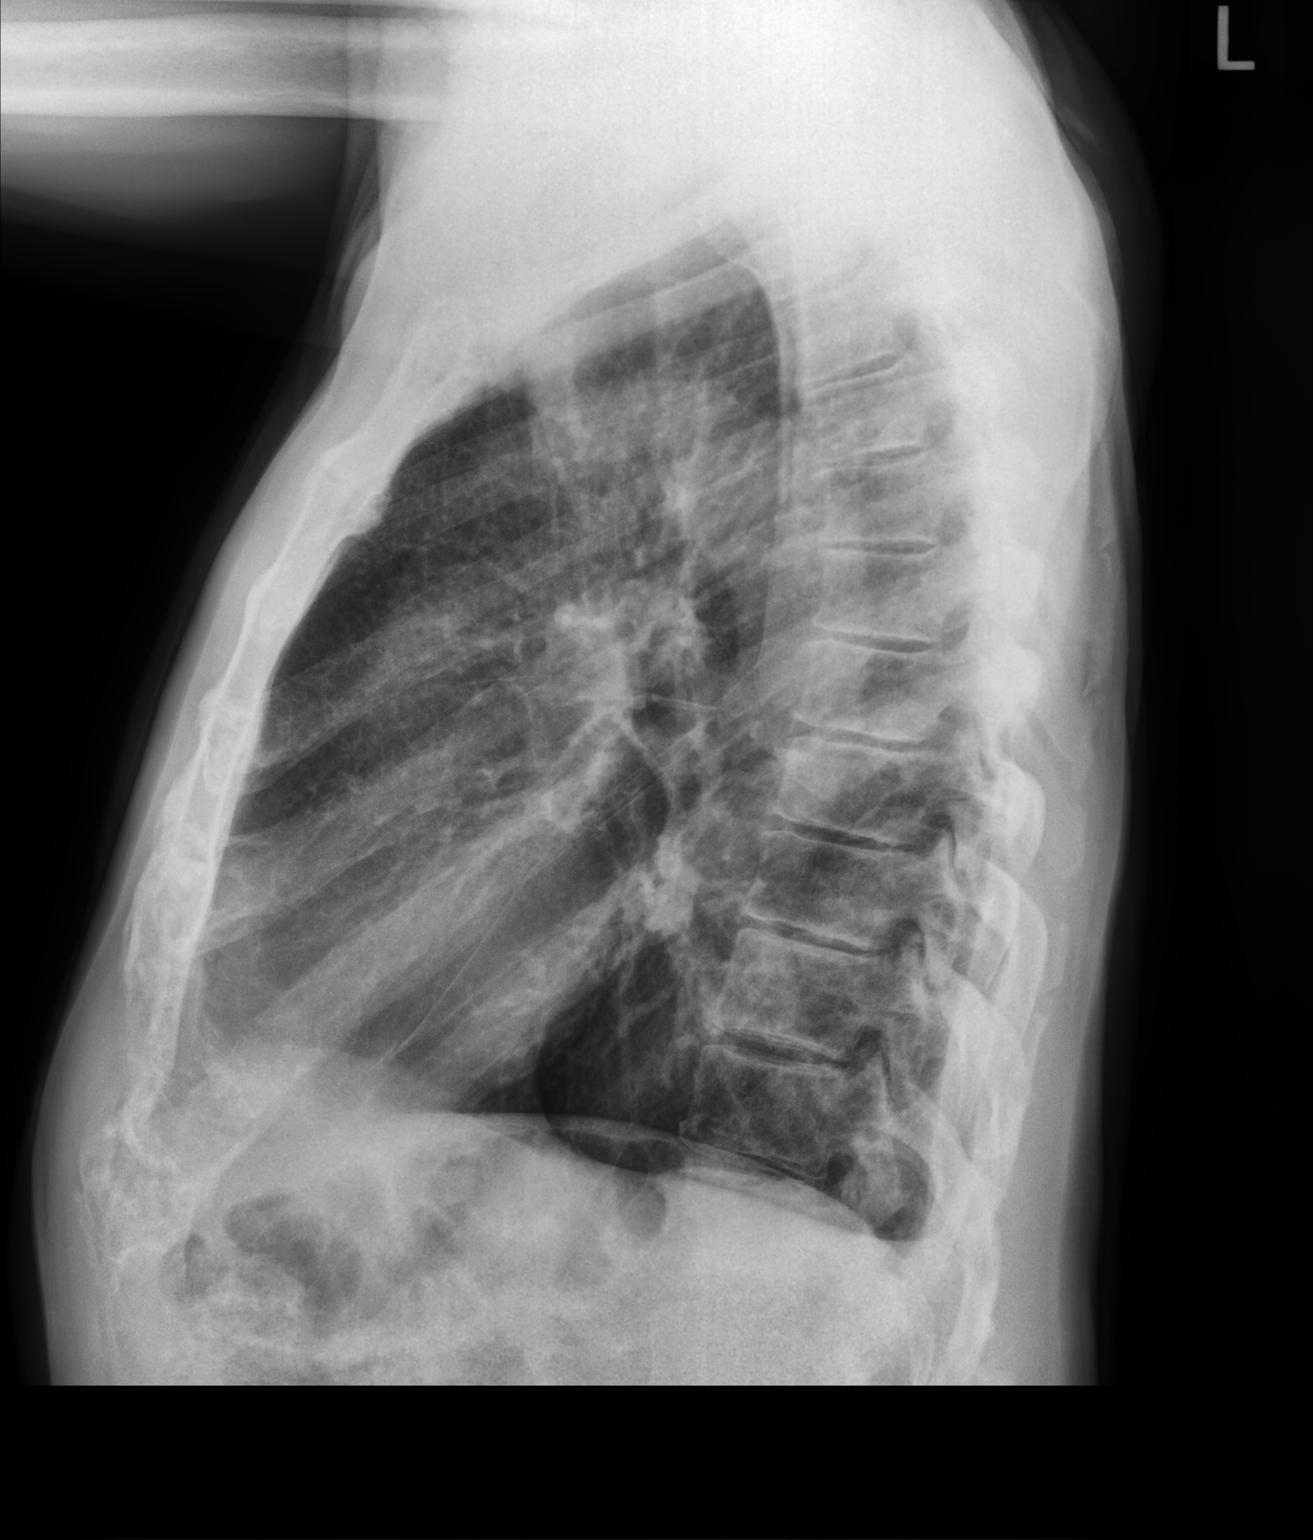

[2 of 2 positions shown; findings below may reference images not displayed]

FINDINGS: Hyperinflation correlating with known COPD. There is no edema,
consolidation, effusion, or pneumothorax. Small right nipple shadow
noted. Normal heart size and mediastinal contours.
IMPRESSION: COPD without acute superimposed finding.

## 2021-06-12 DIAGNOSIS — J449 Chronic obstructive pulmonary disease, unspecified: Secondary | ICD-10-CM | POA: Diagnosis not present

## 2021-06-26 DIAGNOSIS — Z6821 Body mass index (BMI) 21.0-21.9, adult: Secondary | ICD-10-CM | POA: Diagnosis not present

## 2021-06-26 DIAGNOSIS — Z20822 Contact with and (suspected) exposure to covid-19: Secondary | ICD-10-CM | POA: Diagnosis not present

## 2021-06-26 DIAGNOSIS — R059 Cough, unspecified: Secondary | ICD-10-CM | POA: Diagnosis not present

## 2021-06-26 DIAGNOSIS — J441 Chronic obstructive pulmonary disease with (acute) exacerbation: Secondary | ICD-10-CM | POA: Diagnosis not present

## 2021-06-30 DIAGNOSIS — R059 Cough, unspecified: Secondary | ICD-10-CM | POA: Diagnosis not present

## 2021-06-30 DIAGNOSIS — J441 Chronic obstructive pulmonary disease with (acute) exacerbation: Secondary | ICD-10-CM | POA: Diagnosis not present

## 2021-06-30 DIAGNOSIS — Z6822 Body mass index (BMI) 22.0-22.9, adult: Secondary | ICD-10-CM | POA: Diagnosis not present

## 2021-07-03 DIAGNOSIS — H401123 Primary open-angle glaucoma, left eye, severe stage: Secondary | ICD-10-CM | POA: Diagnosis not present

## 2021-07-07 DIAGNOSIS — I10 Essential (primary) hypertension: Secondary | ICD-10-CM | POA: Diagnosis not present

## 2021-07-07 DIAGNOSIS — J441 Chronic obstructive pulmonary disease with (acute) exacerbation: Secondary | ICD-10-CM | POA: Diagnosis not present

## 2021-07-13 DIAGNOSIS — J449 Chronic obstructive pulmonary disease, unspecified: Secondary | ICD-10-CM | POA: Diagnosis not present

## 2021-07-24 DIAGNOSIS — Z9889 Other specified postprocedural states: Secondary | ICD-10-CM | POA: Diagnosis not present

## 2021-07-24 DIAGNOSIS — H401123 Primary open-angle glaucoma, left eye, severe stage: Secondary | ICD-10-CM | POA: Diagnosis not present

## 2021-07-30 DIAGNOSIS — R509 Fever, unspecified: Secondary | ICD-10-CM | POA: Diagnosis not present

## 2021-07-30 DIAGNOSIS — J449 Chronic obstructive pulmonary disease, unspecified: Secondary | ICD-10-CM | POA: Diagnosis not present

## 2021-07-30 DIAGNOSIS — H409 Unspecified glaucoma: Secondary | ICD-10-CM | POA: Diagnosis not present

## 2021-07-30 DIAGNOSIS — Z9981 Dependence on supplemental oxygen: Secondary | ICD-10-CM | POA: Diagnosis not present

## 2021-07-30 DIAGNOSIS — R0600 Dyspnea, unspecified: Secondary | ICD-10-CM | POA: Diagnosis not present

## 2021-07-30 DIAGNOSIS — F17210 Nicotine dependence, cigarettes, uncomplicated: Secondary | ICD-10-CM | POA: Diagnosis not present

## 2021-07-30 DIAGNOSIS — Z20822 Contact with and (suspected) exposure to covid-19: Secondary | ICD-10-CM | POA: Diagnosis not present

## 2021-07-30 DIAGNOSIS — J441 Chronic obstructive pulmonary disease with (acute) exacerbation: Secondary | ICD-10-CM | POA: Diagnosis not present

## 2021-07-31 DIAGNOSIS — R030 Elevated blood-pressure reading, without diagnosis of hypertension: Secondary | ICD-10-CM | POA: Diagnosis not present

## 2021-07-31 DIAGNOSIS — R0902 Hypoxemia: Secondary | ICD-10-CM | POA: Diagnosis not present

## 2021-07-31 DIAGNOSIS — H409 Unspecified glaucoma: Secondary | ICD-10-CM | POA: Diagnosis not present

## 2021-07-31 DIAGNOSIS — R9431 Abnormal electrocardiogram [ECG] [EKG]: Secondary | ICD-10-CM | POA: Diagnosis not present

## 2021-07-31 DIAGNOSIS — J441 Chronic obstructive pulmonary disease with (acute) exacerbation: Secondary | ICD-10-CM | POA: Diagnosis not present

## 2021-07-31 DIAGNOSIS — Z20822 Contact with and (suspected) exposure to covid-19: Secondary | ICD-10-CM | POA: Diagnosis not present

## 2021-07-31 DIAGNOSIS — F17210 Nicotine dependence, cigarettes, uncomplicated: Secondary | ICD-10-CM | POA: Diagnosis not present

## 2021-07-31 DIAGNOSIS — R0600 Dyspnea, unspecified: Secondary | ICD-10-CM | POA: Diagnosis not present

## 2021-07-31 DIAGNOSIS — R509 Fever, unspecified: Secondary | ICD-10-CM | POA: Diagnosis not present

## 2021-07-31 DIAGNOSIS — J449 Chronic obstructive pulmonary disease, unspecified: Secondary | ICD-10-CM | POA: Diagnosis not present

## 2021-07-31 DIAGNOSIS — R0602 Shortness of breath: Secondary | ICD-10-CM | POA: Diagnosis not present

## 2021-08-03 DIAGNOSIS — J449 Chronic obstructive pulmonary disease, unspecified: Secondary | ICD-10-CM | POA: Diagnosis not present

## 2021-08-03 DIAGNOSIS — F17210 Nicotine dependence, cigarettes, uncomplicated: Secondary | ICD-10-CM | POA: Diagnosis not present

## 2021-08-03 DIAGNOSIS — R059 Cough, unspecified: Secondary | ICD-10-CM | POA: Diagnosis not present

## 2021-08-04 DIAGNOSIS — H401123 Primary open-angle glaucoma, left eye, severe stage: Secondary | ICD-10-CM | POA: Diagnosis not present

## 2021-08-04 DIAGNOSIS — H524 Presbyopia: Secondary | ICD-10-CM | POA: Diagnosis not present

## 2021-08-06 ENCOUNTER — Emergency Department (HOSPITAL_COMMUNITY): Payer: Medicare Other

## 2021-08-06 ENCOUNTER — Emergency Department (HOSPITAL_COMMUNITY)
Admission: EM | Admit: 2021-08-06 | Discharge: 2021-08-07 | Disposition: A | Discharge status: Home / Self Care | Payer: Medicare Other | Attending: Emergency Medicine | Admitting: Emergency Medicine

## 2021-08-06 ENCOUNTER — Other Ambulatory Visit: Payer: Self-pay

## 2021-08-06 ENCOUNTER — Encounter (HOSPITAL_COMMUNITY): Payer: Self-pay | Admitting: Emergency Medicine

## 2021-08-06 DIAGNOSIS — Z79899 Other long term (current) drug therapy: Secondary | ICD-10-CM | POA: Insufficient documentation

## 2021-08-06 DIAGNOSIS — F17210 Nicotine dependence, cigarettes, uncomplicated: Secondary | ICD-10-CM | POA: Diagnosis not present

## 2021-08-06 DIAGNOSIS — J449 Chronic obstructive pulmonary disease, unspecified: Secondary | ICD-10-CM | POA: Diagnosis not present

## 2021-08-06 DIAGNOSIS — R0789 Other chest pain: Secondary | ICD-10-CM | POA: Diagnosis not present

## 2021-08-06 DIAGNOSIS — R6889 Other general symptoms and signs: Secondary | ICD-10-CM | POA: Diagnosis not present

## 2021-08-06 DIAGNOSIS — Z743 Need for continuous supervision: Secondary | ICD-10-CM | POA: Diagnosis not present

## 2021-08-06 DIAGNOSIS — I10 Essential (primary) hypertension: Secondary | ICD-10-CM | POA: Diagnosis not present

## 2021-08-06 DIAGNOSIS — Z87891 Personal history of nicotine dependence: Secondary | ICD-10-CM | POA: Insufficient documentation

## 2021-08-06 DIAGNOSIS — R0602 Shortness of breath: Secondary | ICD-10-CM | POA: Diagnosis not present

## 2021-08-06 DIAGNOSIS — J441 Chronic obstructive pulmonary disease with (acute) exacerbation: Secondary | ICD-10-CM | POA: Insufficient documentation

## 2021-08-06 DIAGNOSIS — R079 Chest pain, unspecified: Secondary | ICD-10-CM | POA: Diagnosis not present

## 2021-08-06 LAB — CBC WITH DIFFERENTIAL/PLATELET
Abs Immature Granulocytes: 0.02 10*3/uL (ref 0.00–0.07)
Basophils Absolute: 0 10*3/uL (ref 0.0–0.1)
Basophils Relative: 0 %
Eosinophils Absolute: 0.1 10*3/uL (ref 0.0–0.5)
Eosinophils Relative: 1 %
HCT: 42.6 % (ref 39.0–52.0)
Hemoglobin: 14.9 g/dL (ref 13.0–17.0)
Immature Granulocytes: 0 %
Lymphocytes Relative: 52 %
Lymphs Abs: 4 10*3/uL (ref 0.7–4.0)
MCH: 33.9 pg (ref 26.0–34.0)
MCHC: 35 g/dL (ref 30.0–36.0)
MCV: 97 fL (ref 80.0–100.0)
Monocytes Absolute: 0.9 10*3/uL (ref 0.1–1.0)
Monocytes Relative: 11 %
Neutro Abs: 2.8 10*3/uL (ref 1.7–7.7)
Neutrophils Relative %: 36 %
Platelets: 213 10*3/uL (ref 150–400)
RBC: 4.39 MIL/uL (ref 4.22–5.81)
RDW: 13.4 % (ref 11.5–15.5)
WBC: 7.8 10*3/uL (ref 4.0–10.5)
nRBC: 0 % (ref 0.0–0.2)

## 2021-08-06 LAB — BRAIN NATRIURETIC PEPTIDE: B Natriuretic Peptide: 106.9 pg/mL — ABNORMAL HIGH (ref 0.0–100.0)

## 2021-08-06 LAB — BASIC METABOLIC PANEL
Anion gap: 6 (ref 5–15)
BUN: 12 mg/dL (ref 8–23)
CO2: 28 mmol/L (ref 22–32)
Calcium: 8.4 mg/dL — ABNORMAL LOW (ref 8.9–10.3)
Chloride: 103 mmol/L (ref 98–111)
Creatinine, Ser: 0.76 mg/dL (ref 0.61–1.24)
GFR, Estimated: >60 mL/min (ref >60)
Glucose, Bld: 81 mg/dL (ref 70–99)
Potassium: 3.6 mmol/L (ref 3.5–5.1)
Sodium: 137 mmol/L (ref 135–145)

## 2021-08-06 NOTE — ED Notes (Signed)
Pt's daughter, Sunny Schlein, would like to be contacted when the pt is discharged so she can come pick him up. Mobile number in chart.

## 2021-08-06 NOTE — ED Provider Notes (Signed)
Emergency Medicine Provider Triage Evaluation Note  Daniel Tran , a 75 y.o. male  was evaluated in triage.  Pt complains of worsening shortness of breath for the last week, prior history of COPD on 2 L nasal cannula by EMS.  Reports that shortness of breath is exacerbated with any movement along with any effort  Review of Systems  Positive: Shortness of breath, dry cough Negative: Fever, chest pain  Physical Exam  BP (!) 82/62 (BP Location: Right Arm)   Pulse 82   Resp (!) 22   SpO2 94%  Gen:   Awake, no distress   Resp:  Normal effort  MSK:   Moves extremities without difficulty  Other:  Medical Decision Making  Medically screening exam initiated at 8:20 PM.  Appropriate orders placed.  Fausto Sampedro was informed that the remainder of the evaluation will be completed by another provider, this initial triage assessment does not replace that evaluation, and the importance of remaining in the ED until their evaluation is complete.  Patient here with prior history of COPD soft pressures, satting at 94% on room air, will place on 2 L via nasal cannula or hypoxia.  Will need room ASAP.   Claude Manges, PA-C 08/06/21 2023    Mancel Bale, MD 08/06/21 [phone]

## 2021-08-06 NOTE — ED Notes (Signed)
Patient moved to recliner for comfort °

## 2021-08-06 NOTE — ED Triage Notes (Signed)
Pt c/o increased shortness of breath, hx COPD, as well as intermittent chest pain throughout the day. Denies chest pain at this time. SpO2 98% on room air, EMS placed on [facility] for comfort.

## 2021-08-07 ENCOUNTER — Encounter (HOSPITAL_COMMUNITY): Payer: Self-pay

## 2021-08-07 MED ORDER — IPRATROPIUM-ALBUTEROL 0.5-2.5 (3) MG/3ML IN SOLN
3.0000 mL | Freq: Once | RESPIRATORY_TRACT | Status: AC
  Administered 2021-08-07: 3 mL via RESPIRATORY_TRACT
  Filled 2021-08-07: qty 3

## 2021-08-07 MED ORDER — PROAIR HFA 108 (90 BASE) MCG/ACT IN AERS: INHALATION_SPRAY | RESPIRATORY_TRACT | 0 refills | Status: DC

## 2021-08-07 MED ORDER — METHYLPREDNISOLONE 4 MG PO TBPK: ORAL_TABLET | ORAL | 0 refills | Status: DC

## 2021-08-07 MED ORDER — IPRATROPIUM-ALBUTEROL 0.5-2.5 (3) MG/3ML IN SOLN: 3.0000 mL | RESPIRATORY_TRACT | 0 refills | Status: DC | PRN

## 2021-08-07 MED ORDER — DEXAMETHASONE SODIUM PHOSPHATE 10 MG/ML IJ SOLN
10.0000 mg | Freq: Once | INTRAMUSCULAR | Status: AC
  Administered 2021-08-07: 10 mg via INTRAVENOUS
  Filled 2021-08-07: qty 1

## 2021-08-07 NOTE — ED Provider Notes (Signed)
[facility] EMERGENCY DEPARTMENT Provider Note   CSN: [id number] Arrival date & time: 08/06/21  2002     History Chief Complaint  Patient presents with   Shortness of Breath    Daniel Tran is a 75 y.o. male with a past medical history of COPD, hypertension, who presents to the ED complaining of shortness of breath x 1 week.  Patient has associated wheezing. Patient tried albuterol inhaler and nebulizer treatment with no relief of his symptoms.  Patient has similar episodes during the Thanksgiving holiday and was evaluated in [place] in the ED and treated.  Patient wears 2 L oxygen nightly.  Patient denies chest pain, abdominal pain, nausea, vomiting, fever, chills.  Patient denies sick contacts.   Per patient chart review: Patient had a MSE exam completed on yesterday for similar symptoms.  The history is provided by the patient and a relative. No language interpreter was used.      Past Medical History:  Diagnosis Date   COPD (chronic obstructive pulmonary disease) (HCC)    Hypertension, benign    Seasonal allergic rhinitis     Patient Active Problem List   Diagnosis Date Noted   COPD (chronic obstructive pulmonary disease) (HCC) 12/21/2017   Pulmonary nodule, left 12/21/2017   Tobacco abuse 12/21/2017    History reviewed. No pertinent surgical history.     No family history on file.  Social History   Tobacco Use   Smoking status: Former    Packs/day: 2.00    Years: 60.00    Pack years: 120.00    Types: Cigarettes, Cigars   Smokeless tobacco: Never   Tobacco comments:    smokes 3 cigarettes/day 06/08/2018  Vaping Use   Vaping Use: Never used  Substance Use Topics   Alcohol use: Not Currently   Drug use: Never    Home Medications Prior to Admission medications   Medication Sig Start Date End Date Taking? Authorizing Provider  methylPREDNISolone (MEDROL DOSEPAK) 4 MG TBPK tablet Take as directed. 08/07/21  Yes Amyriah Buras A, PA-C   dorzolamide-timolol (COSOPT) 22.3-6.8 MG/ML ophthalmic solution INSTILL 1 DROP INTO LEFT EYE TWICE DAILY 10/23/18   [provider]  ipratropium-albuterol (DUONEB) 0.5-2.5 (3) MG/3ML SOLN Take 3 mLs by nebulization every 4 (four) hours as needed. 08/07/21   Jennene Downie A, PA-C  latanoprost (XALATAN) 0.005 % ophthalmic solution INSTILL 1 DROP INTO LEFT EYE ONCE DAILY IN THE EVENING 11/02/18   [provider]  neomycin-polymyxin b-dexamethasone (MAXITROL) 3.5-10000-0.1 OINT APPLY A SMALL AMOUNT IN THE CONJUNCTIVAL SAC OF THE LEFT EYE TWICE DAILY FOR 7 TO 10 DAYS 11/06/18   [provider]  PROAIR HFA 108 (90 Base) MCG/ACT inhaler INHALE 2 PUFFS BY MOUTH EVERY 4 HOURS AS NEEDED FOR WHEEZING FOR SHORTNESS OF BREATH 08/07/21   Mickel Schreur A, PA-C  Tiotropium Bromide-Olodaterol (STIOLTO RESPIMAT) 2.5-2.5 MCG/ACT AERS Inhale 2 puffs into the lungs daily. 03/09/19   Coralyn Helling, MD    Allergies    Patient has no known allergies.  Review of Systems   Review of Systems  Constitutional:  Negative for chills and fever.  Respiratory:  Positive for shortness of breath and wheezing. Negative for cough.   Cardiovascular:  Negative for chest pain.  Gastrointestinal:  Negative for abdominal pain, nausea and vomiting.  Skin:  Negative for rash.  All other systems reviewed and are negative.  Physical Exam Updated Vital Signs BP 132/86   Pulse 87   Temp 98.6 F (37 C) (  Oral)   Resp 18   SpO2 100%   Physical Exam Vitals and nursing note reviewed.  Constitutional:      General: He is not in acute distress.    Appearance: He is not diaphoretic.  HENT:     Head: Normocephalic and atraumatic.     Mouth/Throat:     Pharynx: No oropharyngeal exudate.  Eyes:     General: No scleral icterus.    Conjunctiva/sclera: Conjunctivae normal.  Cardiovascular:     Rate and Rhythm: Normal rate and regular rhythm.     Pulses: Normal pulses.     Heart sounds: Normal heart sounds.   Pulmonary:     Effort: Pulmonary effort is normal. No respiratory distress.     Breath sounds: Wheezing present.     Comments: Increased work of breathing.  Inspiratory and expiratory wheezing noted throughout lung fields.  Patent airway. Abdominal:     General: Bowel sounds are normal.     Palpations: Abdomen is soft. There is no mass.     Tenderness: There is no abdominal tenderness. There is no guarding or rebound.  Musculoskeletal:        General: Normal range of motion.     Cervical back: Normal range of motion and neck supple.     Comments: No pitting edema noted bilaterally.  Strength and sensation intact to bilateral upper and lower extremities.  Skin:    General: Skin is warm and dry.  Neurological:     Mental Status: He is alert.  Psychiatric:        Behavior: Behavior normal.    ED Results / Procedures / Treatments   Labs (all labs ordered are listed, but only abnormal results are displayed) Labs Reviewed  BASIC METABOLIC PANEL - Abnormal; Notable for the following components:      Result Value   Calcium 8.4 (*)    All other components within normal limits  BRAIN NATRIURETIC PEPTIDE - Abnormal; Notable for the following components:   B Natriuretic Peptide 106.9 (*)    All other components within normal limits  CBC WITH DIFFERENTIAL/PLATELET    EKG EKG Interpretation  Date/Time:  Thursday August 06 2021 20:16:59 EST Ventricular Rate:  78 PR Interval:  108 QRS Duration: 94 QT Interval:  388 QTC Calculation: 442 R Axis:   -23 Text Interpretation: Sinus rhythm with marked sinus arrhythmia with short PR Right atrial enlargement Incomplete right bundle branch block Nonspecific ST abnormality Abnormal ECG Sinus arrhythmia Confirmed by Coralee Pesa [phone]) on 08/07/2021 1:50:48 PM  Radiology DG Chest 2 View  Result Date: 08/06/2021 CLINICAL DATA:  Short of breath.  History of COPD. EXAM: CHEST - 2 VIEW COMPARISON:  03/26/2021 and older studies. FINDINGS:  Cardiac silhouette is normal in size. Normal mediastinal and hilar contours. Lungs are hyperexpanded, but clear. No pleural effusion or pneumothorax. Skeletal structures are intact. IMPRESSION: No active cardiopulmonary disease. Electronically Signed   By: Amie Portland M.D.   On: 08/06/2021 20:49    Procedures Procedures   Medications Ordered in ED Medications  ipratropium-albuterol (DUONEB) 0.5-2.5 (3) MG/3ML nebulizer solution 3 mL (3 mLs Nebulization Given 08/07/21 1316)  dexamethasone (DECADRON) injection 10 mg (10 mg Intravenous Given 08/07/21 1316)  ipratropium-albuterol (DUONEB) 0.5-2.5 (3) MG/3ML nebulizer solution 3 mL (3 mLs Nebulization Given 08/07/21 1410)    ED Course  I have reviewed the triage vital signs and the nursing notes.  Pertinent labs & imaging results that were available during my care of the patient were  reviewed by me and considered in my medical decision making (see chart for details).  Clinical Course as of 08/07/21 1615  Fri Aug 07, 2021  1346 Patient reevaluated and noted improvement in breathing at this time.  Mild wheezing noted on exam.  No increased work of breathing.  Will reorder DuoNeb. [SB]  1534 Patient reevaluated.  Patient asleep resting comfortably on stretcher.  Lungs clear to auscultation bilaterally.  No increased work of breathing. [SB]  1549 Pulse ox while laying in bed with supplemental oxygen off remained steady at 100%. [SB]  1553 Pulse ox while ambulating, patient maintaining oxygen saturation of 100% without supplemental oxygen.  Discussed discharge treatment plan with patient.  Patient agreeable.  Patient appears safe for discharge at this time. [SB]    Clinical Course User Index [SB] Ariana Juul A, PA-C   MDM Rules/Calculators/A&P                         Pt presents to the ED with shortness of breath x 1 week. He has a history of COPD and wears 2L O2 nightly.  Vital signs stable.  Patient on 3 L oxygen via nasal cannula prior to my  exam.  On exam patient with inspiratory wheezing, increased work of breathing, decreased breath sounds.  Otherwise no acute cardiovascular or abdominal exam findings.  Differential diagnosis includes COPD exacerbation, pneumonia, PE.  BMP, CBC unremarkable.  BNP slightly elevated at 106.9, no prior lab results to compare.  Chest x-ray without acute cardiopulmonary findings.  EKG without ST/T changes.  Doubt PE or pneumonia at this time.  This is likely COPD exacerbation.  Patient given breathing treatment and Decadron in the ED with moderate improvement of his symptoms.  Patient with mild wheezing noted on exam following.  Second breathing treatment given in the ED following with resolution of patient's symptoms.  Lungs clear to auscultation bilaterally following administration of breathing treatments in the ED.  Patient with improvement of his symptoms.  No hypoxia noted with removal of supplemental oxygen while resting in bed, patient oxygen saturation at 100%.  Ambulatory pulse ox maintained at 100% without supplemental oxygen.  This is likely COPD exacerbation.  Case discussed with attending who agrees with discharge treatment plan and evaluated patient in ED.  Discussed with patient importance of following up with primary care provider for further evaluation.  Refill sent for albuterol inhaler, DuoNeb nebulizer solution.  Prescription sent for Medrol Dosepak.  Patient has 3 refills of his tiotropium bromide, inform patient to pick up refill and take as prescribed.  Patient knowledges and verbalized understanding.  Supportive care measures and strict return precautions discussed with patient.  Patient knowledges and verbalized understanding.  Patient appears safe for discharge at this time.  Follow-up as indicated in discharge paperwork.  Final Clinical Impression(s) / ED Diagnoses Final diagnoses:  COPD exacerbation (HCC)    Rx / DC Orders ED Discharge Orders          Ordered    PROAIR HFA 108 (90  Base) MCG/ACT inhaler       Note to Pharmacy: Pt will need OV for further refills.  Thanks   08/07/21 1608    ipratropium-albuterol (DUONEB) 0.5-2.5 (3) MG/3ML SOLN  Every 4 hours PRN        08/07/21 1608    methylPREDNISolone (MEDROL DOSEPAK) 4 MG TBPK tablet        08/07/21 1608  Brizeida Mcmurry A, PA-C 08/07/21 1615    Rozelle Logan, DO 08/10/21 1023

## 2021-08-07 NOTE — ED Notes (Signed)
Oxygen tank replaced °

## 2021-08-07 NOTE — Discharge Instructions (Addendum)
You were given breathing treatments in the ED with resolution of your symptoms.  You were also given Decadron in the ED.  You have 3 refills of your Stiolto Respimat inhaler, pick up your refill.  I will send a refill today for your ProAir inhaler.  Also I will send a refill for your nebulizer breathing treatment solution.  You will be sent prescription for prednisone, complete prescription as prescribed.  Follow-up with your primary care provider for further management of your symptoms.  Return to the ED if you are experiencing increasing/worsening shortness of breath, chest pain, worsening symptoms.

## 2021-08-07 NOTE — ED Notes (Signed)
Pt's walking Sa02 100%

## 2021-08-11 ENCOUNTER — Encounter: Payer: Self-pay | Admitting: *Deleted

## 2021-08-12 DIAGNOSIS — J449 Chronic obstructive pulmonary disease, unspecified: Secondary | ICD-10-CM | POA: Diagnosis not present

## 2021-08-17 DIAGNOSIS — M7989 Other specified soft tissue disorders: Secondary | ICD-10-CM | POA: Diagnosis not present

## 2021-08-17 DIAGNOSIS — R609 Edema, unspecified: Secondary | ICD-10-CM | POA: Diagnosis not present

## 2021-08-17 DIAGNOSIS — M10032 Idiopathic gout, left wrist: Secondary | ICD-10-CM | POA: Diagnosis not present

## 2021-08-17 DIAGNOSIS — M25532 Pain in left wrist: Secondary | ICD-10-CM | POA: Diagnosis not present

## 2021-08-17 DIAGNOSIS — Z743 Need for continuous supervision: Secondary | ICD-10-CM | POA: Diagnosis not present

## 2021-08-17 DIAGNOSIS — M25539 Pain in unspecified wrist: Secondary | ICD-10-CM | POA: Diagnosis not present

## 2021-08-18 DIAGNOSIS — M25511 Pain in right shoulder: Secondary | ICD-10-CM | POA: Diagnosis not present

## 2021-08-18 DIAGNOSIS — Z6821 Body mass index (BMI) 21.0-21.9, adult: Secondary | ICD-10-CM | POA: Diagnosis not present

## 2021-08-19 DIAGNOSIS — J449 Chronic obstructive pulmonary disease, unspecified: Secondary | ICD-10-CM | POA: Diagnosis not present

## 2021-08-21 DIAGNOSIS — J449 Chronic obstructive pulmonary disease, unspecified: Secondary | ICD-10-CM | POA: Diagnosis not present

## 2021-08-21 DIAGNOSIS — F17210 Nicotine dependence, cigarettes, uncomplicated: Secondary | ICD-10-CM | POA: Diagnosis not present

## 2021-08-25 DIAGNOSIS — R079 Chest pain, unspecified: Secondary | ICD-10-CM | POA: Diagnosis not present

## 2021-08-25 DIAGNOSIS — R0689 Other abnormalities of breathing: Secondary | ICD-10-CM | POA: Diagnosis not present

## 2021-08-25 DIAGNOSIS — R911 Solitary pulmonary nodule: Secondary | ICD-10-CM | POA: Diagnosis not present

## 2021-08-25 DIAGNOSIS — M940 Chondrocostal junction syndrome [Tietze]: Secondary | ICD-10-CM | POA: Diagnosis not present

## 2021-08-25 DIAGNOSIS — Z743 Need for continuous supervision: Secondary | ICD-10-CM | POA: Diagnosis not present

## 2021-08-25 DIAGNOSIS — Z20822 Contact with and (suspected) exposure to covid-19: Secondary | ICD-10-CM | POA: Diagnosis not present

## 2021-08-25 DIAGNOSIS — R6889 Other general symptoms and signs: Secondary | ICD-10-CM | POA: Diagnosis not present

## 2021-08-25 DIAGNOSIS — R0789 Other chest pain: Secondary | ICD-10-CM | POA: Diagnosis not present

## 2021-08-26 DIAGNOSIS — M940 Chondrocostal junction syndrome [Tietze]: Secondary | ICD-10-CM | POA: Diagnosis not present

## 2021-08-26 DIAGNOSIS — R911 Solitary pulmonary nodule: Secondary | ICD-10-CM | POA: Diagnosis not present

## 2021-08-28 DIAGNOSIS — M25512 Pain in left shoulder: Secondary | ICD-10-CM | POA: Diagnosis not present

## 2021-08-28 DIAGNOSIS — M4812 Ankylosing hyperostosis [Forestier], cervical region: Secondary | ICD-10-CM | POA: Diagnosis not present

## 2021-08-28 DIAGNOSIS — M5412 Radiculopathy, cervical region: Secondary | ICD-10-CM | POA: Diagnosis not present

## 2021-08-28 DIAGNOSIS — M542 Cervicalgia: Secondary | ICD-10-CM | POA: Diagnosis not present

## 2021-08-28 DIAGNOSIS — M5030 Other cervical disc degeneration, unspecified cervical region: Secondary | ICD-10-CM | POA: Diagnosis not present

## 2021-09-06 DIAGNOSIS — J449 Chronic obstructive pulmonary disease, unspecified: Secondary | ICD-10-CM | POA: Diagnosis not present

## 2021-09-06 DIAGNOSIS — I10 Essential (primary) hypertension: Secondary | ICD-10-CM | POA: Diagnosis not present

## 2021-09-12 DIAGNOSIS — J449 Chronic obstructive pulmonary disease, unspecified: Secondary | ICD-10-CM | POA: Diagnosis not present

## 2021-09-22 DIAGNOSIS — M5416 Radiculopathy, lumbar region: Secondary | ICD-10-CM | POA: Diagnosis not present

## 2021-09-24 DIAGNOSIS — M47816 Spondylosis without myelopathy or radiculopathy, lumbar region: Secondary | ICD-10-CM | POA: Diagnosis not present

## 2021-09-24 DIAGNOSIS — M5136 Other intervertebral disc degeneration, lumbar region: Secondary | ICD-10-CM | POA: Diagnosis not present

## 2021-09-24 DIAGNOSIS — J449 Chronic obstructive pulmonary disease, unspecified: Secondary | ICD-10-CM | POA: Diagnosis not present

## 2021-09-30 ENCOUNTER — Ambulatory Visit: Payer: Medicare Other | Admitting: Cardiology

## 2021-09-30 DIAGNOSIS — M5136 Other intervertebral disc degeneration, lumbar region: Secondary | ICD-10-CM | POA: Diagnosis not present

## 2021-10-07 DIAGNOSIS — J449 Chronic obstructive pulmonary disease, unspecified: Secondary | ICD-10-CM | POA: Diagnosis not present

## 2021-10-07 DIAGNOSIS — I10 Essential (primary) hypertension: Secondary | ICD-10-CM | POA: Diagnosis not present

## 2021-10-13 DIAGNOSIS — J449 Chronic obstructive pulmonary disease, unspecified: Secondary | ICD-10-CM | POA: Diagnosis not present

## 2021-10-22 DIAGNOSIS — M5126 Other intervertebral disc displacement, lumbar region: Secondary | ICD-10-CM | POA: Diagnosis not present

## 2021-10-22 DIAGNOSIS — M5136 Other intervertebral disc degeneration, lumbar region: Secondary | ICD-10-CM | POA: Diagnosis not present

## 2021-10-23 DIAGNOSIS — H401123 Primary open-angle glaucoma, left eye, severe stage: Secondary | ICD-10-CM | POA: Diagnosis not present

## 2021-10-23 DIAGNOSIS — Z9889 Other specified postprocedural states: Secondary | ICD-10-CM | POA: Diagnosis not present

## 2021-11-04 DIAGNOSIS — J449 Chronic obstructive pulmonary disease, unspecified: Secondary | ICD-10-CM | POA: Diagnosis not present

## 2021-11-04 DIAGNOSIS — I10 Essential (primary) hypertension: Secondary | ICD-10-CM | POA: Diagnosis not present

## 2021-11-10 DIAGNOSIS — J449 Chronic obstructive pulmonary disease, unspecified: Secondary | ICD-10-CM | POA: Diagnosis not present

## 2021-11-11 DIAGNOSIS — M5416 Radiculopathy, lumbar region: Secondary | ICD-10-CM | POA: Diagnosis not present

## 2021-11-11 DIAGNOSIS — M5136 Other intervertebral disc degeneration, lumbar region: Secondary | ICD-10-CM | POA: Diagnosis not present

## 2021-11-16 DIAGNOSIS — I10 Essential (primary) hypertension: Secondary | ICD-10-CM | POA: Diagnosis not present

## 2021-11-16 DIAGNOSIS — Z13220 Encounter for screening for lipoid disorders: Secondary | ICD-10-CM | POA: Diagnosis not present

## 2021-11-24 DIAGNOSIS — J441 Chronic obstructive pulmonary disease with (acute) exacerbation: Secondary | ICD-10-CM | POA: Diagnosis not present

## 2021-11-24 DIAGNOSIS — I700 Atherosclerosis of aorta: Secondary | ICD-10-CM | POA: Diagnosis not present

## 2021-11-24 DIAGNOSIS — Z139 Encounter for screening, unspecified: Secondary | ICD-10-CM | POA: Diagnosis not present

## 2021-11-24 DIAGNOSIS — Z720 Tobacco use: Secondary | ICD-10-CM | POA: Diagnosis not present

## 2021-11-24 DIAGNOSIS — Z0000 Encounter for general adult medical examination without abnormal findings: Secondary | ICD-10-CM | POA: Diagnosis not present

## 2021-12-05 DIAGNOSIS — I10 Essential (primary) hypertension: Secondary | ICD-10-CM | POA: Diagnosis not present

## 2021-12-05 DIAGNOSIS — I700 Atherosclerosis of aorta: Secondary | ICD-10-CM | POA: Diagnosis not present

## 2021-12-09 DIAGNOSIS — H401123 Primary open-angle glaucoma, left eye, severe stage: Secondary | ICD-10-CM | POA: Diagnosis not present

## 2021-12-09 DIAGNOSIS — Z961 Presence of intraocular lens: Secondary | ICD-10-CM | POA: Diagnosis not present

## 2021-12-09 DIAGNOSIS — Z9889 Other specified postprocedural states: Secondary | ICD-10-CM | POA: Diagnosis not present

## 2021-12-11 DIAGNOSIS — J449 Chronic obstructive pulmonary disease, unspecified: Secondary | ICD-10-CM | POA: Diagnosis not present

## 2022-01-04 DIAGNOSIS — I10 Essential (primary) hypertension: Secondary | ICD-10-CM | POA: Diagnosis not present

## 2022-01-04 DIAGNOSIS — I700 Atherosclerosis of aorta: Secondary | ICD-10-CM | POA: Diagnosis not present

## 2022-01-10 DIAGNOSIS — J449 Chronic obstructive pulmonary disease, unspecified: Secondary | ICD-10-CM | POA: Diagnosis not present

## 2022-01-20 DIAGNOSIS — H401123 Primary open-angle glaucoma, left eye, severe stage: Secondary | ICD-10-CM | POA: Diagnosis not present

## 2022-01-28 DIAGNOSIS — J441 Chronic obstructive pulmonary disease with (acute) exacerbation: Secondary | ICD-10-CM | POA: Diagnosis not present

## 2022-01-28 DIAGNOSIS — F17210 Nicotine dependence, cigarettes, uncomplicated: Secondary | ICD-10-CM | POA: Diagnosis not present

## 2022-02-04 DIAGNOSIS — I700 Atherosclerosis of aorta: Secondary | ICD-10-CM | POA: Diagnosis not present

## 2022-02-04 DIAGNOSIS — I10 Essential (primary) hypertension: Secondary | ICD-10-CM | POA: Diagnosis not present

## 2022-02-10 DIAGNOSIS — J449 Chronic obstructive pulmonary disease, unspecified: Secondary | ICD-10-CM | POA: Diagnosis not present

## 2022-02-11 DIAGNOSIS — F17210 Nicotine dependence, cigarettes, uncomplicated: Secondary | ICD-10-CM | POA: Diagnosis not present

## 2022-02-11 DIAGNOSIS — J441 Chronic obstructive pulmonary disease with (acute) exacerbation: Secondary | ICD-10-CM | POA: Diagnosis not present

## 2022-03-02 DIAGNOSIS — J441 Chronic obstructive pulmonary disease with (acute) exacerbation: Secondary | ICD-10-CM | POA: Diagnosis not present

## 2022-03-02 DIAGNOSIS — F17210 Nicotine dependence, cigarettes, uncomplicated: Secondary | ICD-10-CM | POA: Diagnosis not present

## 2022-03-05 DIAGNOSIS — I700 Atherosclerosis of aorta: Secondary | ICD-10-CM | POA: Diagnosis not present

## 2022-03-05 DIAGNOSIS — I2510 Atherosclerotic heart disease of native coronary artery without angina pectoris: Secondary | ICD-10-CM | POA: Diagnosis not present

## 2022-03-05 DIAGNOSIS — R0600 Dyspnea, unspecified: Secondary | ICD-10-CM | POA: Diagnosis not present

## 2022-03-05 DIAGNOSIS — J439 Emphysema, unspecified: Secondary | ICD-10-CM | POA: Diagnosis not present

## 2022-03-05 DIAGNOSIS — J432 Centrilobular emphysema: Secondary | ICD-10-CM | POA: Diagnosis not present

## 2022-03-05 DIAGNOSIS — J449 Chronic obstructive pulmonary disease, unspecified: Secondary | ICD-10-CM | POA: Diagnosis not present

## 2022-03-05 DIAGNOSIS — R911 Solitary pulmonary nodule: Secondary | ICD-10-CM | POA: Diagnosis not present

## 2022-03-05 DIAGNOSIS — R0602 Shortness of breath: Secondary | ICD-10-CM | POA: Diagnosis not present

## 2022-03-06 DIAGNOSIS — I10 Essential (primary) hypertension: Secondary | ICD-10-CM | POA: Diagnosis not present

## 2022-03-06 DIAGNOSIS — I700 Atherosclerosis of aorta: Secondary | ICD-10-CM | POA: Diagnosis not present

## 2022-03-11 DIAGNOSIS — J441 Chronic obstructive pulmonary disease with (acute) exacerbation: Secondary | ICD-10-CM | POA: Diagnosis not present

## 2022-03-11 DIAGNOSIS — F17210 Nicotine dependence, cigarettes, uncomplicated: Secondary | ICD-10-CM | POA: Diagnosis not present

## 2022-03-12 DIAGNOSIS — J449 Chronic obstructive pulmonary disease, unspecified: Secondary | ICD-10-CM | POA: Diagnosis not present

## 2022-03-17 DIAGNOSIS — I2510 Atherosclerotic heart disease of native coronary artery without angina pectoris: Secondary | ICD-10-CM | POA: Diagnosis not present

## 2022-03-17 DIAGNOSIS — F17210 Nicotine dependence, cigarettes, uncomplicated: Secondary | ICD-10-CM | POA: Diagnosis not present

## 2022-03-17 DIAGNOSIS — R911 Solitary pulmonary nodule: Secondary | ICD-10-CM | POA: Diagnosis not present

## 2022-03-17 DIAGNOSIS — J441 Chronic obstructive pulmonary disease with (acute) exacerbation: Secondary | ICD-10-CM | POA: Diagnosis not present

## 2022-03-24 DIAGNOSIS — Z9889 Other specified postprocedural states: Secondary | ICD-10-CM | POA: Diagnosis not present

## 2022-03-24 DIAGNOSIS — Z961 Presence of intraocular lens: Secondary | ICD-10-CM | POA: Diagnosis not present

## 2022-03-24 DIAGNOSIS — H401123 Primary open-angle glaucoma, left eye, severe stage: Secondary | ICD-10-CM | POA: Diagnosis not present

## 2022-03-26 DIAGNOSIS — I10 Essential (primary) hypertension: Secondary | ICD-10-CM | POA: Diagnosis not present

## 2022-03-26 DIAGNOSIS — I2510 Atherosclerotic heart disease of native coronary artery without angina pectoris: Secondary | ICD-10-CM | POA: Diagnosis not present

## 2022-03-26 DIAGNOSIS — R0602 Shortness of breath: Secondary | ICD-10-CM | POA: Diagnosis not present

## 2022-03-26 DIAGNOSIS — Z13220 Encounter for screening for lipoid disorders: Secondary | ICD-10-CM | POA: Diagnosis not present

## 2022-04-05 DIAGNOSIS — I2510 Atherosclerotic heart disease of native coronary artery without angina pectoris: Secondary | ICD-10-CM | POA: Diagnosis not present

## 2022-04-05 DIAGNOSIS — F17210 Nicotine dependence, cigarettes, uncomplicated: Secondary | ICD-10-CM | POA: Diagnosis not present

## 2022-04-05 DIAGNOSIS — J441 Chronic obstructive pulmonary disease with (acute) exacerbation: Secondary | ICD-10-CM | POA: Diagnosis not present

## 2022-04-05 DIAGNOSIS — R911 Solitary pulmonary nodule: Secondary | ICD-10-CM | POA: Diagnosis not present

## 2022-04-06 DIAGNOSIS — I10 Essential (primary) hypertension: Secondary | ICD-10-CM | POA: Diagnosis not present

## 2022-04-06 DIAGNOSIS — I700 Atherosclerosis of aorta: Secondary | ICD-10-CM | POA: Diagnosis not present

## 2022-04-12 DIAGNOSIS — J449 Chronic obstructive pulmonary disease, unspecified: Secondary | ICD-10-CM | POA: Diagnosis not present

## 2022-04-13 DIAGNOSIS — Z6820 Body mass index (BMI) 20.0-20.9, adult: Secondary | ICD-10-CM | POA: Diagnosis not present

## 2022-04-13 DIAGNOSIS — J441 Chronic obstructive pulmonary disease with (acute) exacerbation: Secondary | ICD-10-CM | POA: Diagnosis not present

## 2022-04-13 DIAGNOSIS — I10 Essential (primary) hypertension: Secondary | ICD-10-CM | POA: Diagnosis not present

## 2022-04-13 DIAGNOSIS — R5383 Other fatigue: Secondary | ICD-10-CM | POA: Diagnosis not present

## 2022-04-13 DIAGNOSIS — Z720 Tobacco use: Secondary | ICD-10-CM | POA: Diagnosis not present

## 2022-04-13 DIAGNOSIS — F17210 Nicotine dependence, cigarettes, uncomplicated: Secondary | ICD-10-CM | POA: Diagnosis not present

## 2022-04-15 DIAGNOSIS — E538 Deficiency of other specified B group vitamins: Secondary | ICD-10-CM | POA: Diagnosis not present

## 2022-04-23 DIAGNOSIS — E538 Deficiency of other specified B group vitamins: Secondary | ICD-10-CM | POA: Diagnosis not present

## 2022-04-28 DIAGNOSIS — I2510 Atherosclerotic heart disease of native coronary artery without angina pectoris: Secondary | ICD-10-CM | POA: Diagnosis not present

## 2022-04-28 DIAGNOSIS — J441 Chronic obstructive pulmonary disease with (acute) exacerbation: Secondary | ICD-10-CM | POA: Diagnosis not present

## 2022-04-28 DIAGNOSIS — R911 Solitary pulmonary nodule: Secondary | ICD-10-CM | POA: Diagnosis not present

## 2022-04-28 DIAGNOSIS — F17210 Nicotine dependence, cigarettes, uncomplicated: Secondary | ICD-10-CM | POA: Diagnosis not present

## 2022-04-30 DIAGNOSIS — E538 Deficiency of other specified B group vitamins: Secondary | ICD-10-CM | POA: Diagnosis not present

## 2022-05-07 DIAGNOSIS — E538 Deficiency of other specified B group vitamins: Secondary | ICD-10-CM | POA: Diagnosis not present

## 2022-05-07 DIAGNOSIS — I10 Essential (primary) hypertension: Secondary | ICD-10-CM | POA: Diagnosis not present

## 2022-05-07 DIAGNOSIS — I700 Atherosclerosis of aorta: Secondary | ICD-10-CM | POA: Diagnosis not present

## 2022-05-12 DIAGNOSIS — R0602 Shortness of breath: Secondary | ICD-10-CM | POA: Diagnosis not present

## 2022-05-13 DIAGNOSIS — J449 Chronic obstructive pulmonary disease, unspecified: Secondary | ICD-10-CM | POA: Diagnosis not present

## 2022-05-14 DIAGNOSIS — E538 Deficiency of other specified B group vitamins: Secondary | ICD-10-CM | POA: Diagnosis not present

## 2022-05-21 DIAGNOSIS — E538 Deficiency of other specified B group vitamins: Secondary | ICD-10-CM | POA: Diagnosis not present

## 2022-05-28 DIAGNOSIS — E538 Deficiency of other specified B group vitamins: Secondary | ICD-10-CM | POA: Diagnosis not present

## 2022-06-04 DIAGNOSIS — Z041 Encounter for examination and observation following transport accident: Secondary | ICD-10-CM | POA: Diagnosis not present

## 2022-06-04 DIAGNOSIS — M5442 Lumbago with sciatica, left side: Secondary | ICD-10-CM | POA: Diagnosis not present

## 2022-06-04 DIAGNOSIS — E538 Deficiency of other specified B group vitamins: Secondary | ICD-10-CM | POA: Diagnosis not present

## 2022-06-04 DIAGNOSIS — M5136 Other intervertebral disc degeneration, lumbar region: Secondary | ICD-10-CM | POA: Diagnosis not present

## 2022-06-04 DIAGNOSIS — J449 Chronic obstructive pulmonary disease, unspecified: Secondary | ICD-10-CM | POA: Diagnosis not present

## 2022-06-06 DIAGNOSIS — I700 Atherosclerosis of aorta: Secondary | ICD-10-CM | POA: Diagnosis not present

## 2022-06-06 DIAGNOSIS — I10 Essential (primary) hypertension: Secondary | ICD-10-CM | POA: Diagnosis not present

## 2022-06-09 ENCOUNTER — Telehealth: Payer: Self-pay | Admitting: *Deleted

## 2022-06-09 NOTE — Telephone Encounter (Signed)
     Patient  visit on 06/04/2022  at [facility] ed  was for treatment   Have you been able to follow up with your primary care physician?  The patient was  able to obtain any needed medicine or equipment.  Are there diet recommendations that you are having difficulty following?  Patient expresses understanding of discharge instructions and education provided has no other needs at this time.    [place] [phone] [address] , [place] [address] Email : [email or web address]

## 2022-06-12 DIAGNOSIS — J449 Chronic obstructive pulmonary disease, unspecified: Secondary | ICD-10-CM | POA: Diagnosis not present

## 2022-06-23 DIAGNOSIS — H02131 Senile ectropion of right upper eyelid: Secondary | ICD-10-CM | POA: Diagnosis not present

## 2022-06-23 DIAGNOSIS — Z9889 Other specified postprocedural states: Secondary | ICD-10-CM | POA: Diagnosis not present

## 2022-06-23 DIAGNOSIS — H401123 Primary open-angle glaucoma, left eye, severe stage: Secondary | ICD-10-CM | POA: Diagnosis not present

## 2022-06-23 DIAGNOSIS — H02132 Senile ectropion of right lower eyelid: Secondary | ICD-10-CM | POA: Diagnosis not present

## 2022-06-25 DIAGNOSIS — H401133 Primary open-angle glaucoma, bilateral, severe stage: Secondary | ICD-10-CM | POA: Diagnosis not present

## 2022-06-25 DIAGNOSIS — H52222 Regular astigmatism, left eye: Secondary | ICD-10-CM | POA: Diagnosis not present

## 2022-06-28 DIAGNOSIS — R0602 Shortness of breath: Secondary | ICD-10-CM | POA: Diagnosis not present

## 2022-06-28 DIAGNOSIS — I10 Essential (primary) hypertension: Secondary | ICD-10-CM | POA: Diagnosis not present

## 2022-06-28 DIAGNOSIS — R059 Cough, unspecified: Secondary | ICD-10-CM | POA: Diagnosis not present

## 2022-06-28 DIAGNOSIS — J9811 Atelectasis: Secondary | ICD-10-CM | POA: Diagnosis not present

## 2022-06-28 DIAGNOSIS — R931 Abnormal findings on diagnostic imaging of heart and coronary circulation: Secondary | ICD-10-CM | POA: Diagnosis not present

## 2022-06-28 DIAGNOSIS — Z743 Need for continuous supervision: Secondary | ICD-10-CM | POA: Diagnosis not present

## 2022-06-28 DIAGNOSIS — J9601 Acute respiratory failure with hypoxia: Secondary | ICD-10-CM | POA: Diagnosis not present

## 2022-06-28 DIAGNOSIS — J441 Chronic obstructive pulmonary disease with (acute) exacerbation: Secondary | ICD-10-CM | POA: Diagnosis not present

## 2022-06-28 DIAGNOSIS — R0609 Other forms of dyspnea: Secondary | ICD-10-CM | POA: Diagnosis not present

## 2022-06-29 DIAGNOSIS — Z79899 Other long term (current) drug therapy: Secondary | ICD-10-CM | POA: Diagnosis not present

## 2022-06-29 DIAGNOSIS — J9610 Chronic respiratory failure, unspecified whether with hypoxia or hypercapnia: Secondary | ICD-10-CM | POA: Diagnosis not present

## 2022-06-29 DIAGNOSIS — J9601 Acute respiratory failure with hypoxia: Secondary | ICD-10-CM | POA: Diagnosis not present

## 2022-06-29 DIAGNOSIS — R931 Abnormal findings on diagnostic imaging of heart and coronary circulation: Secondary | ICD-10-CM | POA: Diagnosis not present

## 2022-06-29 DIAGNOSIS — J441 Chronic obstructive pulmonary disease with (acute) exacerbation: Secondary | ICD-10-CM | POA: Diagnosis not present

## 2022-06-29 DIAGNOSIS — M1990 Unspecified osteoarthritis, unspecified site: Secondary | ICD-10-CM | POA: Diagnosis not present

## 2022-06-29 DIAGNOSIS — F17210 Nicotine dependence, cigarettes, uncomplicated: Secondary | ICD-10-CM | POA: Diagnosis not present

## 2022-06-29 DIAGNOSIS — Z9981 Dependence on supplemental oxygen: Secondary | ICD-10-CM | POA: Diagnosis not present

## 2022-06-29 DIAGNOSIS — R0602 Shortness of breath: Secondary | ICD-10-CM | POA: Diagnosis not present

## 2022-06-29 DIAGNOSIS — Z1152 Encounter for screening for COVID-19: Secondary | ICD-10-CM | POA: Diagnosis not present

## 2022-06-29 DIAGNOSIS — I10 Essential (primary) hypertension: Secondary | ICD-10-CM | POA: Diagnosis not present

## 2022-06-29 DIAGNOSIS — J9811 Atelectasis: Secondary | ICD-10-CM | POA: Diagnosis not present

## 2022-07-05 DIAGNOSIS — I2510 Atherosclerotic heart disease of native coronary artery without angina pectoris: Secondary | ICD-10-CM | POA: Diagnosis not present

## 2022-07-05 DIAGNOSIS — F17210 Nicotine dependence, cigarettes, uncomplicated: Secondary | ICD-10-CM | POA: Diagnosis not present

## 2022-07-05 DIAGNOSIS — R911 Solitary pulmonary nodule: Secondary | ICD-10-CM | POA: Diagnosis not present

## 2022-07-05 DIAGNOSIS — J441 Chronic obstructive pulmonary disease with (acute) exacerbation: Secondary | ICD-10-CM | POA: Diagnosis not present

## 2022-07-06 DIAGNOSIS — R051 Acute cough: Secondary | ICD-10-CM | POA: Diagnosis not present

## 2022-07-06 DIAGNOSIS — R0600 Dyspnea, unspecified: Secondary | ICD-10-CM | POA: Diagnosis not present

## 2022-07-07 DIAGNOSIS — I10 Essential (primary) hypertension: Secondary | ICD-10-CM | POA: Diagnosis not present

## 2022-07-07 DIAGNOSIS — I700 Atherosclerosis of aorta: Secondary | ICD-10-CM | POA: Diagnosis not present

## 2022-07-08 DIAGNOSIS — J441 Chronic obstructive pulmonary disease with (acute) exacerbation: Secondary | ICD-10-CM | POA: Diagnosis not present

## 2022-07-08 DIAGNOSIS — E538 Deficiency of other specified B group vitamins: Secondary | ICD-10-CM | POA: Diagnosis not present

## 2022-07-08 DIAGNOSIS — F17210 Nicotine dependence, cigarettes, uncomplicated: Secondary | ICD-10-CM | POA: Diagnosis not present

## 2022-07-08 DIAGNOSIS — Z720 Tobacco use: Secondary | ICD-10-CM | POA: Diagnosis not present

## 2022-07-08 DIAGNOSIS — Z6820 Body mass index (BMI) 20.0-20.9, adult: Secondary | ICD-10-CM | POA: Diagnosis not present

## 2022-07-12 DIAGNOSIS — Z6821 Body mass index (BMI) 21.0-21.9, adult: Secondary | ICD-10-CM | POA: Diagnosis not present

## 2022-07-12 DIAGNOSIS — M542 Cervicalgia: Secondary | ICD-10-CM | POA: Diagnosis not present

## 2022-07-13 DIAGNOSIS — J449 Chronic obstructive pulmonary disease, unspecified: Secondary | ICD-10-CM | POA: Diagnosis not present

## 2022-07-15 DIAGNOSIS — R911 Solitary pulmonary nodule: Secondary | ICD-10-CM | POA: Diagnosis not present

## 2022-07-15 DIAGNOSIS — R918 Other nonspecific abnormal finding of lung field: Secondary | ICD-10-CM | POA: Diagnosis not present

## 2022-07-15 DIAGNOSIS — J439 Emphysema, unspecified: Secondary | ICD-10-CM | POA: Diagnosis not present

## 2022-07-25 DIAGNOSIS — J441 Chronic obstructive pulmonary disease with (acute) exacerbation: Secondary | ICD-10-CM | POA: Diagnosis not present

## 2022-07-25 DIAGNOSIS — I4510 Unspecified right bundle-branch block: Secondary | ICD-10-CM | POA: Diagnosis not present

## 2022-07-25 DIAGNOSIS — R062 Wheezing: Secondary | ICD-10-CM | POA: Diagnosis not present

## 2022-07-25 DIAGNOSIS — R0602 Shortness of breath: Secondary | ICD-10-CM | POA: Diagnosis not present

## 2022-07-25 DIAGNOSIS — F17210 Nicotine dependence, cigarettes, uncomplicated: Secondary | ICD-10-CM | POA: Diagnosis not present

## 2022-07-25 DIAGNOSIS — R911 Solitary pulmonary nodule: Secondary | ICD-10-CM | POA: Diagnosis not present

## 2022-07-25 DIAGNOSIS — I639 Cerebral infarction, unspecified: Secondary | ICD-10-CM | POA: Diagnosis not present

## 2022-07-25 DIAGNOSIS — R6889 Other general symptoms and signs: Secondary | ICD-10-CM | POA: Diagnosis not present

## 2022-07-25 DIAGNOSIS — I10 Essential (primary) hypertension: Secondary | ICD-10-CM | POA: Diagnosis not present

## 2022-07-25 DIAGNOSIS — Z743 Need for continuous supervision: Secondary | ICD-10-CM | POA: Diagnosis not present

## 2022-07-26 DIAGNOSIS — R911 Solitary pulmonary nodule: Secondary | ICD-10-CM | POA: Diagnosis not present

## 2022-07-26 DIAGNOSIS — R0602 Shortness of breath: Secondary | ICD-10-CM | POA: Diagnosis not present

## 2022-07-28 DIAGNOSIS — H401123 Primary open-angle glaucoma, left eye, severe stage: Secondary | ICD-10-CM | POA: Diagnosis not present

## 2022-07-28 DIAGNOSIS — H02135 Senile ectropion of left lower eyelid: Secondary | ICD-10-CM | POA: Diagnosis not present

## 2022-08-04 DIAGNOSIS — F17210 Nicotine dependence, cigarettes, uncomplicated: Secondary | ICD-10-CM | POA: Diagnosis not present

## 2022-08-04 DIAGNOSIS — J449 Chronic obstructive pulmonary disease, unspecified: Secondary | ICD-10-CM | POA: Diagnosis not present

## 2022-08-04 DIAGNOSIS — I2510 Atherosclerotic heart disease of native coronary artery without angina pectoris: Secondary | ICD-10-CM | POA: Diagnosis not present

## 2022-08-06 DIAGNOSIS — I700 Atherosclerosis of aorta: Secondary | ICD-10-CM | POA: Diagnosis not present

## 2022-08-06 DIAGNOSIS — I10 Essential (primary) hypertension: Secondary | ICD-10-CM | POA: Diagnosis not present

## 2022-08-11 ENCOUNTER — Telehealth: Payer: Self-pay

## 2022-08-11 NOTE — Telephone Encounter (Signed)
        Patient  visited [facility] on 07/26/2022  for not in Epic chart.   Telephone encounter attempt :  1st  Unable to leave message voicemail does not  pick up.   Jeremia Groot [name]  [facility] Population Health Community Resource Care Guide   [email or web address]  ?? [phone]   Website: [email or web address]  [email or web address]

## 2022-08-12 ENCOUNTER — Telehealth: Payer: Self-pay

## 2022-08-12 DIAGNOSIS — Z23 Encounter for immunization: Secondary | ICD-10-CM | POA: Diagnosis not present

## 2022-08-12 DIAGNOSIS — Z20822 Contact with and (suspected) exposure to covid-19: Secondary | ICD-10-CM | POA: Diagnosis not present

## 2022-08-12 DIAGNOSIS — R059 Cough, unspecified: Secondary | ICD-10-CM | POA: Diagnosis not present

## 2022-08-12 DIAGNOSIS — J449 Chronic obstructive pulmonary disease, unspecified: Secondary | ICD-10-CM | POA: Diagnosis not present

## 2022-08-12 DIAGNOSIS — U071 COVID-19: Secondary | ICD-10-CM | POA: Diagnosis not present

## 2022-08-12 DIAGNOSIS — Z6821 Body mass index (BMI) 21.0-21.9, adult: Secondary | ICD-10-CM | POA: Diagnosis not present

## 2022-08-12 NOTE — Telephone Encounter (Signed)
        Patient  visited  Hospital on 07/26/2022  for treatment.   Telephone encounter attempt :  2nd  A HIPAA compliant voice message was left requesting a return call.  Instructed patient to call back at [phone].   Bronte Kropf [place]  [facility] Population Health Community Resource Care Guide   [email or web address]  ?? [phone]   Website: [email or web address]    

## 2022-08-13 DIAGNOSIS — F17210 Nicotine dependence, cigarettes, uncomplicated: Secondary | ICD-10-CM | POA: Diagnosis not present

## 2022-08-13 DIAGNOSIS — U071 COVID-19: Secondary | ICD-10-CM | POA: Diagnosis not present

## 2022-08-13 DIAGNOSIS — I2510 Atherosclerotic heart disease of native coronary artery without angina pectoris: Secondary | ICD-10-CM | POA: Diagnosis not present

## 2022-08-13 DIAGNOSIS — Z03818 Encounter for observation for suspected exposure to other biological agents ruled out: Secondary | ICD-10-CM | POA: Diagnosis not present

## 2022-08-13 DIAGNOSIS — R051 Acute cough: Secondary | ICD-10-CM | POA: Diagnosis not present

## 2022-08-13 DIAGNOSIS — J449 Chronic obstructive pulmonary disease, unspecified: Secondary | ICD-10-CM | POA: Diagnosis not present

## 2022-08-16 ENCOUNTER — Telehealth: Payer: Self-pay

## 2022-08-16 NOTE — Telephone Encounter (Signed)
     Patient  visit on 07/26/2022  at [facility] was for COPD.  Have you been able to follow up with your primary care physician? Patient followed up with his PCP, tested positive for COVID-19, is being treated and is feeling better.  The patient was or was not able to obtain any needed medicine or equipment. Patient obtained medication.   Are there diet recommendations that you are having difficulty following? No dietary recommendations.  Patient expresses understanding of discharge instructions and education provided has no other needs at this time.    Ian Cavey [name]  [facility] Population Health Community Resource Care Guide   [email or web address]  ?? [phone]   Website: [email or web address]  [email or web address].com

## 2022-08-29 DIAGNOSIS — R519 Headache, unspecified: Secondary | ICD-10-CM | POA: Diagnosis not present

## 2022-08-29 DIAGNOSIS — R531 Weakness: Secondary | ICD-10-CM | POA: Diagnosis not present

## 2022-08-29 DIAGNOSIS — Z5321 Procedure and treatment not carried out due to patient leaving prior to being seen by health care provider: Secondary | ICD-10-CM | POA: Diagnosis not present

## 2022-08-30 DIAGNOSIS — J0100 Acute maxillary sinusitis, unspecified: Secondary | ICD-10-CM | POA: Diagnosis not present

## 2022-08-30 DIAGNOSIS — I10 Essential (primary) hypertension: Secondary | ICD-10-CM | POA: Diagnosis not present

## 2022-08-30 DIAGNOSIS — J449 Chronic obstructive pulmonary disease, unspecified: Secondary | ICD-10-CM | POA: Diagnosis not present

## 2022-08-30 DIAGNOSIS — F17210 Nicotine dependence, cigarettes, uncomplicated: Secondary | ICD-10-CM | POA: Diagnosis not present

## 2022-08-30 DIAGNOSIS — R519 Headache, unspecified: Secondary | ICD-10-CM | POA: Diagnosis not present

## 2022-08-30 DIAGNOSIS — R51 Headache with orthostatic component, not elsewhere classified: Secondary | ICD-10-CM | POA: Diagnosis not present

## 2022-08-30 DIAGNOSIS — G500 Trigeminal neuralgia: Secondary | ICD-10-CM | POA: Diagnosis not present

## 2023-02-05 DIAGNOSIS — D649 Anemia, unspecified: Secondary | ICD-10-CM | POA: Diagnosis not present

## 2023-02-05 DIAGNOSIS — I10 Essential (primary) hypertension: Secondary | ICD-10-CM | POA: Diagnosis not present

## 2023-02-11 DIAGNOSIS — J449 Chronic obstructive pulmonary disease, unspecified: Secondary | ICD-10-CM | POA: Diagnosis not present

## 2023-02-23 DIAGNOSIS — H401123 Primary open-angle glaucoma, left eye, severe stage: Secondary | ICD-10-CM | POA: Diagnosis not present

## 2023-02-25 DIAGNOSIS — J441 Chronic obstructive pulmonary disease with (acute) exacerbation: Secondary | ICD-10-CM | POA: Diagnosis not present

## 2023-02-25 DIAGNOSIS — Z681 Body mass index (BMI) 19 or less, adult: Secondary | ICD-10-CM | POA: Diagnosis not present

## 2023-02-25 DIAGNOSIS — R0602 Shortness of breath: Secondary | ICD-10-CM | POA: Diagnosis not present

## 2023-02-25 DIAGNOSIS — R918 Other nonspecific abnormal finding of lung field: Secondary | ICD-10-CM | POA: Diagnosis not present

## 2023-02-25 DIAGNOSIS — R0989 Other specified symptoms and signs involving the circulatory and respiratory systems: Secondary | ICD-10-CM | POA: Diagnosis not present

## 2023-02-25 DIAGNOSIS — J439 Emphysema, unspecified: Secondary | ICD-10-CM | POA: Diagnosis not present

## 2023-02-25 DIAGNOSIS — J449 Chronic obstructive pulmonary disease, unspecified: Secondary | ICD-10-CM | POA: Diagnosis not present

## 2023-03-03 DIAGNOSIS — I2510 Atherosclerotic heart disease of native coronary artery without angina pectoris: Secondary | ICD-10-CM | POA: Diagnosis not present

## 2023-03-03 DIAGNOSIS — F17210 Nicotine dependence, cigarettes, uncomplicated: Secondary | ICD-10-CM | POA: Diagnosis not present

## 2023-03-03 DIAGNOSIS — J449 Chronic obstructive pulmonary disease, unspecified: Secondary | ICD-10-CM | POA: Diagnosis not present

## 2023-03-07 DIAGNOSIS — D649 Anemia, unspecified: Secondary | ICD-10-CM | POA: Diagnosis not present

## 2023-03-07 DIAGNOSIS — J441 Chronic obstructive pulmonary disease with (acute) exacerbation: Secondary | ICD-10-CM | POA: Diagnosis not present

## 2023-03-13 DIAGNOSIS — J449 Chronic obstructive pulmonary disease, unspecified: Secondary | ICD-10-CM | POA: Diagnosis not present

## 2023-03-17 DIAGNOSIS — Z8616 Personal history of COVID-19: Secondary | ICD-10-CM | POA: Diagnosis not present

## 2023-03-17 DIAGNOSIS — F17210 Nicotine dependence, cigarettes, uncomplicated: Secondary | ICD-10-CM | POA: Diagnosis not present

## 2023-03-17 DIAGNOSIS — J449 Chronic obstructive pulmonary disease, unspecified: Secondary | ICD-10-CM | POA: Diagnosis not present

## 2023-03-17 DIAGNOSIS — I2510 Atherosclerotic heart disease of native coronary artery without angina pectoris: Secondary | ICD-10-CM | POA: Diagnosis not present

## 2023-03-29 DIAGNOSIS — R9431 Abnormal electrocardiogram [ECG] [EKG]: Secondary | ICD-10-CM | POA: Diagnosis not present

## 2023-03-29 DIAGNOSIS — Z743 Need for continuous supervision: Secondary | ICD-10-CM | POA: Diagnosis not present

## 2023-03-29 DIAGNOSIS — R051 Acute cough: Secondary | ICD-10-CM | POA: Diagnosis not present

## 2023-03-29 DIAGNOSIS — I499 Cardiac arrhythmia, unspecified: Secondary | ICD-10-CM | POA: Diagnosis not present

## 2023-03-29 DIAGNOSIS — Z792 Long term (current) use of antibiotics: Secondary | ICD-10-CM | POA: Diagnosis not present

## 2023-03-29 DIAGNOSIS — R069 Unspecified abnormalities of breathing: Secondary | ICD-10-CM | POA: Diagnosis not present

## 2023-03-29 DIAGNOSIS — Z9981 Dependence on supplemental oxygen: Secondary | ICD-10-CM | POA: Diagnosis not present

## 2023-03-29 DIAGNOSIS — R0689 Other abnormalities of breathing: Secondary | ICD-10-CM | POA: Diagnosis not present

## 2023-03-29 DIAGNOSIS — R0603 Acute respiratory distress: Secondary | ICD-10-CM | POA: Diagnosis not present

## 2023-03-29 DIAGNOSIS — R0602 Shortness of breath: Secondary | ICD-10-CM | POA: Diagnosis not present

## 2023-03-29 DIAGNOSIS — J432 Centrilobular emphysema: Secondary | ICD-10-CM | POA: Diagnosis not present

## 2023-03-29 DIAGNOSIS — I444 Left anterior fascicular block: Secondary | ICD-10-CM | POA: Diagnosis not present

## 2023-03-29 DIAGNOSIS — J441 Chronic obstructive pulmonary disease with (acute) exacerbation: Secondary | ICD-10-CM | POA: Diagnosis not present

## 2023-03-29 DIAGNOSIS — Z79899 Other long term (current) drug therapy: Secondary | ICD-10-CM | POA: Diagnosis not present

## 2023-03-29 DIAGNOSIS — R6889 Other general symptoms and signs: Secondary | ICD-10-CM | POA: Diagnosis not present

## 2023-03-29 DIAGNOSIS — F03A0 Unspecified dementia, mild, without behavioral disturbance, psychotic disturbance, mood disturbance, and anxiety: Secondary | ICD-10-CM | POA: Diagnosis not present

## 2023-03-29 DIAGNOSIS — M1990 Unspecified osteoarthritis, unspecified site: Secondary | ICD-10-CM | POA: Diagnosis not present

## 2023-03-29 DIAGNOSIS — Z7951 Long term (current) use of inhaled steroids: Secondary | ICD-10-CM | POA: Diagnosis not present

## 2023-03-29 DIAGNOSIS — I10 Essential (primary) hypertension: Secondary | ICD-10-CM | POA: Diagnosis not present

## 2023-03-29 DIAGNOSIS — F17210 Nicotine dependence, cigarettes, uncomplicated: Secondary | ICD-10-CM | POA: Diagnosis not present

## 2023-03-29 DIAGNOSIS — J209 Acute bronchitis, unspecified: Secondary | ICD-10-CM | POA: Diagnosis not present

## 2023-03-29 DIAGNOSIS — J440 Chronic obstructive pulmonary disease with acute lower respiratory infection: Secondary | ICD-10-CM | POA: Diagnosis not present

## 2023-03-29 DIAGNOSIS — R918 Other nonspecific abnormal finding of lung field: Secondary | ICD-10-CM | POA: Diagnosis not present

## 2023-03-29 DIAGNOSIS — Z7952 Long term (current) use of systemic steroids: Secondary | ICD-10-CM | POA: Diagnosis not present

## 2023-03-29 DIAGNOSIS — I517 Cardiomegaly: Secondary | ICD-10-CM | POA: Diagnosis not present

## 2023-03-29 DIAGNOSIS — J9601 Acute respiratory failure with hypoxia: Secondary | ICD-10-CM | POA: Diagnosis not present

## 2023-03-29 DIAGNOSIS — J449 Chronic obstructive pulmonary disease, unspecified: Secondary | ICD-10-CM | POA: Diagnosis not present

## 2023-04-04 DIAGNOSIS — R0602 Shortness of breath: Secondary | ICD-10-CM | POA: Diagnosis not present

## 2023-04-04 DIAGNOSIS — I4510 Unspecified right bundle-branch block: Secondary | ICD-10-CM | POA: Diagnosis not present

## 2023-04-04 DIAGNOSIS — Z6823 Body mass index (BMI) 23.0-23.9, adult: Secondary | ICD-10-CM | POA: Diagnosis not present

## 2023-04-04 DIAGNOSIS — I444 Left anterior fascicular block: Secondary | ICD-10-CM | POA: Diagnosis not present

## 2023-04-04 DIAGNOSIS — F17210 Nicotine dependence, cigarettes, uncomplicated: Secondary | ICD-10-CM | POA: Diagnosis not present

## 2023-04-04 DIAGNOSIS — M7989 Other specified soft tissue disorders: Secondary | ICD-10-CM | POA: Diagnosis not present

## 2023-04-04 DIAGNOSIS — Z7689 Persons encountering health services in other specified circumstances: Secondary | ICD-10-CM | POA: Diagnosis not present

## 2023-04-04 DIAGNOSIS — R9431 Abnormal electrocardiogram [ECG] [EKG]: Secondary | ICD-10-CM | POA: Diagnosis not present

## 2023-04-04 DIAGNOSIS — R600 Localized edema: Secondary | ICD-10-CM | POA: Diagnosis not present

## 2023-04-04 DIAGNOSIS — J40 Bronchitis, not specified as acute or chronic: Secondary | ICD-10-CM | POA: Diagnosis not present

## 2023-04-04 DIAGNOSIS — J441 Chronic obstructive pulmonary disease with (acute) exacerbation: Secondary | ICD-10-CM | POA: Diagnosis not present

## 2023-04-04 DIAGNOSIS — J9610 Chronic respiratory failure, unspecified whether with hypoxia or hypercapnia: Secondary | ICD-10-CM | POA: Diagnosis not present

## 2023-04-04 DIAGNOSIS — J449 Chronic obstructive pulmonary disease, unspecified: Secondary | ICD-10-CM | POA: Diagnosis not present

## 2023-04-04 DIAGNOSIS — F17200 Nicotine dependence, unspecified, uncomplicated: Secondary | ICD-10-CM | POA: Diagnosis not present

## 2023-04-04 DIAGNOSIS — I517 Cardiomegaly: Secondary | ICD-10-CM | POA: Diagnosis not present

## 2023-04-04 DIAGNOSIS — R079 Chest pain, unspecified: Secondary | ICD-10-CM | POA: Diagnosis not present

## 2023-04-07 DIAGNOSIS — R918 Other nonspecific abnormal finding of lung field: Secondary | ICD-10-CM | POA: Diagnosis not present

## 2023-04-07 DIAGNOSIS — F17210 Nicotine dependence, cigarettes, uncomplicated: Secondary | ICD-10-CM | POA: Diagnosis not present

## 2023-04-07 DIAGNOSIS — I2510 Atherosclerotic heart disease of native coronary artery without angina pectoris: Secondary | ICD-10-CM | POA: Diagnosis not present

## 2023-04-07 DIAGNOSIS — J9610 Chronic respiratory failure, unspecified whether with hypoxia or hypercapnia: Secondary | ICD-10-CM | POA: Diagnosis not present

## 2023-04-07 DIAGNOSIS — J449 Chronic obstructive pulmonary disease, unspecified: Secondary | ICD-10-CM | POA: Diagnosis not present

## 2023-04-07 DIAGNOSIS — J441 Chronic obstructive pulmonary disease with (acute) exacerbation: Secondary | ICD-10-CM | POA: Diagnosis not present

## 2023-04-12 ENCOUNTER — Telehealth: Payer: Self-pay

## 2023-04-12 DIAGNOSIS — J9610 Chronic respiratory failure, unspecified whether with hypoxia or hypercapnia: Secondary | ICD-10-CM | POA: Diagnosis not present

## 2023-04-12 NOTE — Telephone Encounter (Signed)
Transition Care Management Unsuccessful Follow-up Telephone Call  Date of discharge and from where:  04/05/2023 [facility]  Attempts:  1st Attempt  Reason for unsuccessful TCM follow-up call:  Unable to leave message  [facility]  [facility] Population Health Community Resource Care Guide   [email or web address]  ?? [phone]   Website: [email or web address]  [email or web address]

## 2023-04-13 ENCOUNTER — Telehealth: Payer: Self-pay

## 2023-04-13 DIAGNOSIS — J449 Chronic obstructive pulmonary disease, unspecified: Secondary | ICD-10-CM | POA: Diagnosis not present

## 2023-04-13 NOTE — Telephone Encounter (Signed)
Transition Care Management Unsuccessful Follow-up Telephone Call  Date of discharge and from where:  04/05/2023 [facility]  Attempts:  2nd Attempt  Reason for unsuccessful TCM follow-up call:  Left voice message  [name] [name]  [facility] Population Health Community Resource Care Guide   [name]  ?? [phone]   Website: [email or web address]  [email or web address].com

## 2023-04-18 DIAGNOSIS — J449 Chronic obstructive pulmonary disease, unspecified: Secondary | ICD-10-CM | POA: Diagnosis not present

## 2023-04-18 DIAGNOSIS — I2510 Atherosclerotic heart disease of native coronary artery without angina pectoris: Secondary | ICD-10-CM | POA: Diagnosis not present

## 2023-04-21 DIAGNOSIS — F17210 Nicotine dependence, cigarettes, uncomplicated: Secondary | ICD-10-CM | POA: Diagnosis not present

## 2023-04-21 DIAGNOSIS — I2510 Atherosclerotic heart disease of native coronary artery without angina pectoris: Secondary | ICD-10-CM | POA: Diagnosis not present

## 2023-04-21 DIAGNOSIS — R918 Other nonspecific abnormal finding of lung field: Secondary | ICD-10-CM | POA: Diagnosis not present

## 2023-04-21 DIAGNOSIS — J449 Chronic obstructive pulmonary disease, unspecified: Secondary | ICD-10-CM | POA: Diagnosis not present

## 2023-04-27 DIAGNOSIS — R252 Cramp and spasm: Secondary | ICD-10-CM | POA: Diagnosis not present

## 2023-05-04 DIAGNOSIS — R0609 Other forms of dyspnea: Secondary | ICD-10-CM | POA: Diagnosis not present

## 2023-05-04 DIAGNOSIS — M7989 Other specified soft tissue disorders: Secondary | ICD-10-CM | POA: Diagnosis not present

## 2023-05-08 DIAGNOSIS — J441 Chronic obstructive pulmonary disease with (acute) exacerbation: Secondary | ICD-10-CM | POA: Diagnosis not present

## 2023-05-08 DIAGNOSIS — J9610 Chronic respiratory failure, unspecified whether with hypoxia or hypercapnia: Secondary | ICD-10-CM | POA: Diagnosis not present

## 2023-05-10 DIAGNOSIS — J449 Chronic obstructive pulmonary disease, unspecified: Secondary | ICD-10-CM | POA: Diagnosis not present

## 2023-05-10 DIAGNOSIS — I2510 Atherosclerotic heart disease of native coronary artery without angina pectoris: Secondary | ICD-10-CM | POA: Diagnosis not present

## 2023-05-11 DIAGNOSIS — R9431 Abnormal electrocardiogram [ECG] [EKG]: Secondary | ICD-10-CM | POA: Diagnosis not present

## 2023-05-11 DIAGNOSIS — Z743 Need for continuous supervision: Secondary | ICD-10-CM | POA: Diagnosis not present

## 2023-05-11 DIAGNOSIS — R0602 Shortness of breath: Secondary | ICD-10-CM | POA: Diagnosis not present

## 2023-05-11 DIAGNOSIS — J441 Chronic obstructive pulmonary disease with (acute) exacerbation: Secondary | ICD-10-CM | POA: Diagnosis not present

## 2023-05-11 DIAGNOSIS — J449 Chronic obstructive pulmonary disease, unspecified: Secondary | ICD-10-CM | POA: Diagnosis not present

## 2023-05-11 DIAGNOSIS — I10 Essential (primary) hypertension: Secondary | ICD-10-CM | POA: Diagnosis not present

## 2023-05-11 DIAGNOSIS — R059 Cough, unspecified: Secondary | ICD-10-CM | POA: Diagnosis not present

## 2023-05-11 DIAGNOSIS — R062 Wheezing: Secondary | ICD-10-CM | POA: Diagnosis not present

## 2023-05-12 DIAGNOSIS — F17210 Nicotine dependence, cigarettes, uncomplicated: Secondary | ICD-10-CM | POA: Diagnosis not present

## 2023-05-12 DIAGNOSIS — R918 Other nonspecific abnormal finding of lung field: Secondary | ICD-10-CM | POA: Diagnosis not present

## 2023-05-12 DIAGNOSIS — J449 Chronic obstructive pulmonary disease, unspecified: Secondary | ICD-10-CM | POA: Diagnosis not present

## 2023-05-12 DIAGNOSIS — I2510 Atherosclerotic heart disease of native coronary artery without angina pectoris: Secondary | ICD-10-CM | POA: Diagnosis not present

## 2023-05-13 DIAGNOSIS — I441 Atrioventricular block, second degree: Secondary | ICD-10-CM | POA: Diagnosis not present

## 2023-05-13 DIAGNOSIS — T17998A Other foreign object in respiratory tract, part unspecified causing other injury, initial encounter: Secondary | ICD-10-CM | POA: Diagnosis not present

## 2023-05-13 DIAGNOSIS — R9431 Abnormal electrocardiogram [ECG] [EKG]: Secondary | ICD-10-CM | POA: Diagnosis not present

## 2023-05-13 DIAGNOSIS — T17890A Other foreign object in other parts of respiratory tract causing asphyxiation, initial encounter: Secondary | ICD-10-CM | POA: Diagnosis not present

## 2023-05-13 DIAGNOSIS — J90 Pleural effusion, not elsewhere classified: Secondary | ICD-10-CM | POA: Diagnosis not present

## 2023-05-13 DIAGNOSIS — R079 Chest pain, unspecified: Secondary | ICD-10-CM | POA: Diagnosis not present

## 2023-05-13 DIAGNOSIS — Z8709 Personal history of other diseases of the respiratory system: Secondary | ICD-10-CM | POA: Diagnosis not present

## 2023-05-13 DIAGNOSIS — J449 Chronic obstructive pulmonary disease, unspecified: Secondary | ICD-10-CM | POA: Diagnosis not present

## 2023-05-13 DIAGNOSIS — R058 Other specified cough: Secondary | ICD-10-CM | POA: Diagnosis not present

## 2023-05-13 DIAGNOSIS — I517 Cardiomegaly: Secondary | ICD-10-CM | POA: Diagnosis not present

## 2023-05-14 DIAGNOSIS — R062 Wheezing: Secondary | ICD-10-CM | POA: Diagnosis not present

## 2023-05-14 DIAGNOSIS — I4510 Unspecified right bundle-branch block: Secondary | ICD-10-CM | POA: Diagnosis not present

## 2023-05-14 DIAGNOSIS — I517 Cardiomegaly: Secondary | ICD-10-CM | POA: Diagnosis not present

## 2023-05-14 DIAGNOSIS — R0602 Shortness of breath: Secondary | ICD-10-CM | POA: Diagnosis not present

## 2023-05-14 DIAGNOSIS — I21A1 Myocardial infarction type 2: Secondary | ICD-10-CM | POA: Diagnosis not present

## 2023-05-14 DIAGNOSIS — I491 Atrial premature depolarization: Secondary | ICD-10-CM | POA: Diagnosis not present

## 2023-05-14 DIAGNOSIS — R0689 Other abnormalities of breathing: Secondary | ICD-10-CM | POA: Diagnosis not present

## 2023-05-14 DIAGNOSIS — I2582 Chronic total occlusion of coronary artery: Secondary | ICD-10-CM | POA: Diagnosis not present

## 2023-05-14 DIAGNOSIS — J9622 Acute and chronic respiratory failure with hypercapnia: Secondary | ICD-10-CM | POA: Diagnosis not present

## 2023-05-14 DIAGNOSIS — F17210 Nicotine dependence, cigarettes, uncomplicated: Secondary | ICD-10-CM | POA: Diagnosis not present

## 2023-05-14 DIAGNOSIS — D539 Nutritional anemia, unspecified: Secondary | ICD-10-CM | POA: Diagnosis not present

## 2023-05-14 DIAGNOSIS — D7589 Other specified diseases of blood and blood-forming organs: Secondary | ICD-10-CM | POA: Diagnosis not present

## 2023-05-14 DIAGNOSIS — J449 Chronic obstructive pulmonary disease, unspecified: Secondary | ICD-10-CM | POA: Diagnosis not present

## 2023-05-14 DIAGNOSIS — B379 Candidiasis, unspecified: Secondary | ICD-10-CM | POA: Diagnosis not present

## 2023-05-14 DIAGNOSIS — R7989 Other specified abnormal findings of blood chemistry: Secondary | ICD-10-CM | POA: Diagnosis not present

## 2023-05-14 DIAGNOSIS — J9621 Acute and chronic respiratory failure with hypoxia: Secondary | ICD-10-CM | POA: Diagnosis not present

## 2023-05-14 DIAGNOSIS — J441 Chronic obstructive pulmonary disease with (acute) exacerbation: Secondary | ICD-10-CM | POA: Diagnosis not present

## 2023-05-14 DIAGNOSIS — I2510 Atherosclerotic heart disease of native coronary artery without angina pectoris: Secondary | ICD-10-CM | POA: Diagnosis not present

## 2023-05-14 DIAGNOSIS — I498 Other specified cardiac arrhythmias: Secondary | ICD-10-CM | POA: Diagnosis not present

## 2023-05-14 DIAGNOSIS — R6889 Other general symptoms and signs: Secondary | ICD-10-CM | POA: Diagnosis not present

## 2023-05-14 DIAGNOSIS — D649 Anemia, unspecified: Secondary | ICD-10-CM | POA: Diagnosis not present

## 2023-05-14 DIAGNOSIS — Z20822 Contact with and (suspected) exposure to covid-19: Secondary | ICD-10-CM | POA: Diagnosis not present

## 2023-05-14 DIAGNOSIS — R079 Chest pain, unspecified: Secondary | ICD-10-CM | POA: Diagnosis not present

## 2023-05-14 DIAGNOSIS — M129 Arthropathy, unspecified: Secondary | ICD-10-CM | POA: Diagnosis not present

## 2023-05-14 DIAGNOSIS — Z9981 Dependence on supplemental oxygen: Secondary | ICD-10-CM | POA: Diagnosis not present

## 2023-05-14 DIAGNOSIS — R0600 Dyspnea, unspecified: Secondary | ICD-10-CM | POA: Diagnosis not present

## 2023-05-14 DIAGNOSIS — I252 Old myocardial infarction: Secondary | ICD-10-CM | POA: Diagnosis not present

## 2023-05-14 DIAGNOSIS — I471 Supraventricular tachycardia, unspecified: Secondary | ICD-10-CM | POA: Diagnosis not present

## 2023-05-14 DIAGNOSIS — Z743 Need for continuous supervision: Secondary | ICD-10-CM | POA: Diagnosis not present

## 2023-05-14 DIAGNOSIS — I214 Non-ST elevation (NSTEMI) myocardial infarction: Secondary | ICD-10-CM | POA: Diagnosis not present

## 2023-05-15 DIAGNOSIS — Z9981 Dependence on supplemental oxygen: Secondary | ICD-10-CM

## 2023-05-15 DIAGNOSIS — R7989 Other specified abnormal findings of blood chemistry: Secondary | ICD-10-CM

## 2023-05-15 HISTORY — DX: Dependence on supplemental oxygen: Z99.81

## 2023-05-15 HISTORY — DX: Other specified abnormal findings of blood chemistry: R79.89

## 2023-05-19 DIAGNOSIS — B370 Candidal stomatitis: Secondary | ICD-10-CM | POA: Insufficient documentation

## 2023-05-19 HISTORY — DX: Candidal stomatitis: B37.0

## 2023-05-20 DIAGNOSIS — J9690 Respiratory failure, unspecified, unspecified whether with hypoxia or hypercapnia: Secondary | ICD-10-CM

## 2023-05-20 HISTORY — DX: Respiratory failure, unspecified, unspecified whether with hypoxia or hypercapnia: J96.90

## 2023-05-21 DIAGNOSIS — D7589 Other specified diseases of blood and blood-forming organs: Secondary | ICD-10-CM | POA: Insufficient documentation

## 2023-05-21 HISTORY — DX: Other specified diseases of blood and blood-forming organs: D75.89

## 2023-05-22 DIAGNOSIS — I214 Non-ST elevation (NSTEMI) myocardial infarction: Secondary | ICD-10-CM | POA: Insufficient documentation

## 2023-05-22 HISTORY — DX: Non-ST elevation (NSTEMI) myocardial infarction: I21.4

## 2023-05-29 DIAGNOSIS — J441 Chronic obstructive pulmonary disease with (acute) exacerbation: Secondary | ICD-10-CM | POA: Diagnosis not present

## 2023-05-29 DIAGNOSIS — R252 Cramp and spasm: Secondary | ICD-10-CM | POA: Diagnosis not present

## 2023-05-29 DIAGNOSIS — I471 Supraventricular tachycardia, unspecified: Secondary | ICD-10-CM | POA: Diagnosis not present

## 2023-05-29 DIAGNOSIS — J9621 Acute and chronic respiratory failure with hypoxia: Secondary | ICD-10-CM | POA: Diagnosis not present

## 2023-05-30 DIAGNOSIS — R109 Unspecified abdominal pain: Secondary | ICD-10-CM | POA: Diagnosis not present

## 2023-05-30 DIAGNOSIS — R000 Tachycardia, unspecified: Secondary | ICD-10-CM | POA: Diagnosis not present

## 2023-05-30 DIAGNOSIS — Z955 Presence of coronary angioplasty implant and graft: Secondary | ICD-10-CM | POA: Diagnosis not present

## 2023-05-30 DIAGNOSIS — R519 Headache, unspecified: Secondary | ICD-10-CM | POA: Diagnosis not present

## 2023-05-30 DIAGNOSIS — R7989 Other specified abnormal findings of blood chemistry: Secondary | ICD-10-CM | POA: Diagnosis not present

## 2023-05-30 DIAGNOSIS — Z79899 Other long term (current) drug therapy: Secondary | ICD-10-CM | POA: Diagnosis not present

## 2023-05-30 DIAGNOSIS — I499 Cardiac arrhythmia, unspecified: Secondary | ICD-10-CM | POA: Diagnosis not present

## 2023-05-30 DIAGNOSIS — I2510 Atherosclerotic heart disease of native coronary artery without angina pectoris: Secondary | ICD-10-CM | POA: Diagnosis not present

## 2023-05-30 DIAGNOSIS — I252 Old myocardial infarction: Secondary | ICD-10-CM | POA: Diagnosis not present

## 2023-05-30 DIAGNOSIS — J929 Pleural plaque without asbestos: Secondary | ICD-10-CM | POA: Diagnosis not present

## 2023-05-30 DIAGNOSIS — R1032 Left lower quadrant pain: Secondary | ICD-10-CM | POA: Diagnosis not present

## 2023-05-30 DIAGNOSIS — R9431 Abnormal electrocardiogram [ECG] [EKG]: Secondary | ICD-10-CM | POA: Diagnosis not present

## 2023-05-30 DIAGNOSIS — Z743 Need for continuous supervision: Secondary | ICD-10-CM | POA: Diagnosis not present

## 2023-05-30 DIAGNOSIS — J432 Centrilobular emphysema: Secondary | ICD-10-CM | POA: Diagnosis not present

## 2023-05-30 DIAGNOSIS — I4510 Unspecified right bundle-branch block: Secondary | ICD-10-CM | POA: Diagnosis not present

## 2023-05-30 DIAGNOSIS — I517 Cardiomegaly: Secondary | ICD-10-CM | POA: Diagnosis not present

## 2023-05-30 DIAGNOSIS — F17210 Nicotine dependence, cigarettes, uncomplicated: Secondary | ICD-10-CM | POA: Diagnosis not present

## 2023-05-30 DIAGNOSIS — Z7982 Long term (current) use of aspirin: Secondary | ICD-10-CM | POA: Diagnosis not present

## 2023-05-30 DIAGNOSIS — R1010 Upper abdominal pain, unspecified: Secondary | ICD-10-CM | POA: Diagnosis not present

## 2023-05-30 DIAGNOSIS — R079 Chest pain, unspecified: Secondary | ICD-10-CM | POA: Diagnosis not present

## 2023-05-30 DIAGNOSIS — I7143 Infrarenal abdominal aortic aneurysm, without rupture: Secondary | ICD-10-CM | POA: Diagnosis not present

## 2023-05-30 DIAGNOSIS — I719 Aortic aneurysm of unspecified site, without rupture: Secondary | ICD-10-CM | POA: Diagnosis not present

## 2023-05-30 DIAGNOSIS — M48061 Spinal stenosis, lumbar region without neurogenic claudication: Secondary | ICD-10-CM | POA: Diagnosis not present

## 2023-05-31 DIAGNOSIS — R109 Unspecified abdominal pain: Secondary | ICD-10-CM | POA: Diagnosis not present

## 2023-05-31 DIAGNOSIS — J441 Chronic obstructive pulmonary disease with (acute) exacerbation: Secondary | ICD-10-CM | POA: Diagnosis not present

## 2023-05-31 DIAGNOSIS — R0789 Other chest pain: Secondary | ICD-10-CM | POA: Diagnosis not present

## 2023-05-31 DIAGNOSIS — R079 Chest pain, unspecified: Secondary | ICD-10-CM | POA: Diagnosis not present

## 2023-05-31 DIAGNOSIS — R0989 Other specified symptoms and signs involving the circulatory and respiratory systems: Secondary | ICD-10-CM | POA: Diagnosis not present

## 2023-05-31 DIAGNOSIS — Z79899 Other long term (current) drug therapy: Secondary | ICD-10-CM | POA: Diagnosis not present

## 2023-05-31 DIAGNOSIS — I517 Cardiomegaly: Secondary | ICD-10-CM | POA: Diagnosis not present

## 2023-05-31 DIAGNOSIS — J449 Chronic obstructive pulmonary disease, unspecified: Secondary | ICD-10-CM | POA: Diagnosis not present

## 2023-05-31 DIAGNOSIS — R1012 Left upper quadrant pain: Secondary | ICD-10-CM | POA: Diagnosis not present

## 2023-05-31 DIAGNOSIS — I444 Left anterior fascicular block: Secondary | ICD-10-CM | POA: Diagnosis not present

## 2023-05-31 DIAGNOSIS — Z20822 Contact with and (suspected) exposure to covid-19: Secondary | ICD-10-CM | POA: Diagnosis not present

## 2023-05-31 DIAGNOSIS — R091 Pleurisy: Secondary | ICD-10-CM | POA: Diagnosis not present

## 2023-05-31 DIAGNOSIS — D3502 Benign neoplasm of left adrenal gland: Secondary | ICD-10-CM | POA: Diagnosis not present

## 2023-05-31 DIAGNOSIS — R9431 Abnormal electrocardiogram [ECG] [EKG]: Secondary | ICD-10-CM | POA: Diagnosis not present

## 2023-06-01 DIAGNOSIS — R091 Pleurisy: Secondary | ICD-10-CM | POA: Diagnosis not present

## 2023-06-01 DIAGNOSIS — J441 Chronic obstructive pulmonary disease with (acute) exacerbation: Secondary | ICD-10-CM | POA: Diagnosis not present

## 2023-06-01 DIAGNOSIS — R079 Chest pain, unspecified: Secondary | ICD-10-CM | POA: Diagnosis not present

## 2023-06-08 DIAGNOSIS — Z23 Encounter for immunization: Secondary | ICD-10-CM | POA: Diagnosis not present

## 2023-06-14 DIAGNOSIS — J449 Chronic obstructive pulmonary disease, unspecified: Secondary | ICD-10-CM | POA: Diagnosis not present

## 2023-06-14 DIAGNOSIS — M1990 Unspecified osteoarthritis, unspecified site: Secondary | ICD-10-CM | POA: Diagnosis not present

## 2023-06-14 DIAGNOSIS — J441 Chronic obstructive pulmonary disease with (acute) exacerbation: Secondary | ICD-10-CM | POA: Diagnosis not present

## 2023-06-14 DIAGNOSIS — R609 Edema, unspecified: Secondary | ICD-10-CM | POA: Diagnosis not present

## 2023-06-14 DIAGNOSIS — F17210 Nicotine dependence, cigarettes, uncomplicated: Secondary | ICD-10-CM | POA: Diagnosis not present

## 2023-06-14 DIAGNOSIS — R0602 Shortness of breath: Secondary | ICD-10-CM | POA: Diagnosis not present

## 2023-06-14 DIAGNOSIS — I10 Essential (primary) hypertension: Secondary | ICD-10-CM | POA: Diagnosis not present

## 2023-06-14 DIAGNOSIS — F03A0 Unspecified dementia, mild, without behavioral disturbance, psychotic disturbance, mood disturbance, and anxiety: Secondary | ICD-10-CM | POA: Diagnosis not present

## 2023-06-15 DIAGNOSIS — R6889 Other general symptoms and signs: Secondary | ICD-10-CM | POA: Diagnosis not present

## 2023-06-15 DIAGNOSIS — R457 State of emotional shock and stress, unspecified: Secondary | ICD-10-CM | POA: Diagnosis not present

## 2023-06-15 DIAGNOSIS — Z792 Long term (current) use of antibiotics: Secondary | ICD-10-CM | POA: Diagnosis not present

## 2023-06-15 DIAGNOSIS — Z7952 Long term (current) use of systemic steroids: Secondary | ICD-10-CM | POA: Diagnosis not present

## 2023-06-15 DIAGNOSIS — F17210 Nicotine dependence, cigarettes, uncomplicated: Secondary | ICD-10-CM | POA: Diagnosis not present

## 2023-06-15 DIAGNOSIS — R069 Unspecified abnormalities of breathing: Secondary | ICD-10-CM | POA: Diagnosis not present

## 2023-06-15 DIAGNOSIS — R0602 Shortness of breath: Secondary | ICD-10-CM | POA: Diagnosis not present

## 2023-06-15 DIAGNOSIS — J441 Chronic obstructive pulmonary disease with (acute) exacerbation: Secondary | ICD-10-CM | POA: Diagnosis not present

## 2023-06-20 DIAGNOSIS — J449 Chronic obstructive pulmonary disease, unspecified: Secondary | ICD-10-CM | POA: Diagnosis not present

## 2023-06-20 DIAGNOSIS — R079 Chest pain, unspecified: Secondary | ICD-10-CM | POA: Diagnosis not present

## 2023-06-20 DIAGNOSIS — I279 Pulmonary heart disease, unspecified: Secondary | ICD-10-CM | POA: Diagnosis not present

## 2023-06-20 DIAGNOSIS — R0789 Other chest pain: Secondary | ICD-10-CM | POA: Diagnosis not present

## 2023-06-20 DIAGNOSIS — I4510 Unspecified right bundle-branch block: Secondary | ICD-10-CM | POA: Diagnosis not present

## 2023-06-20 DIAGNOSIS — I249 Acute ischemic heart disease, unspecified: Secondary | ICD-10-CM | POA: Diagnosis not present

## 2023-06-20 DIAGNOSIS — I444 Left anterior fascicular block: Secondary | ICD-10-CM | POA: Diagnosis not present

## 2023-06-20 DIAGNOSIS — I498 Other specified cardiac arrhythmias: Secondary | ICD-10-CM | POA: Diagnosis not present

## 2023-06-20 DIAGNOSIS — R9431 Abnormal electrocardiogram [ECG] [EKG]: Secondary | ICD-10-CM | POA: Diagnosis not present

## 2023-06-20 DIAGNOSIS — I517 Cardiomegaly: Secondary | ICD-10-CM | POA: Diagnosis not present

## 2023-06-20 DIAGNOSIS — Z955 Presence of coronary angioplasty implant and graft: Secondary | ICD-10-CM | POA: Diagnosis not present

## 2023-06-20 DIAGNOSIS — R109 Unspecified abdominal pain: Secondary | ICD-10-CM | POA: Diagnosis not present

## 2023-06-20 DIAGNOSIS — R0602 Shortness of breath: Secondary | ICD-10-CM | POA: Diagnosis not present

## 2023-06-21 DIAGNOSIS — Z720 Tobacco use: Secondary | ICD-10-CM

## 2023-06-21 DIAGNOSIS — R079 Chest pain, unspecified: Secondary | ICD-10-CM | POA: Diagnosis not present

## 2023-06-21 DIAGNOSIS — I4510 Unspecified right bundle-branch block: Secondary | ICD-10-CM | POA: Diagnosis not present

## 2023-06-21 DIAGNOSIS — J441 Chronic obstructive pulmonary disease with (acute) exacerbation: Secondary | ICD-10-CM | POA: Diagnosis not present

## 2023-06-21 DIAGNOSIS — I2510 Atherosclerotic heart disease of native coronary artery without angina pectoris: Secondary | ICD-10-CM | POA: Diagnosis not present

## 2023-06-21 DIAGNOSIS — I444 Left anterior fascicular block: Secondary | ICD-10-CM | POA: Diagnosis not present

## 2023-06-21 DIAGNOSIS — R109 Unspecified abdominal pain: Secondary | ICD-10-CM | POA: Diagnosis not present

## 2023-06-21 DIAGNOSIS — R0602 Shortness of breath: Secondary | ICD-10-CM | POA: Diagnosis not present

## 2023-06-21 DIAGNOSIS — J449 Chronic obstructive pulmonary disease, unspecified: Secondary | ICD-10-CM | POA: Diagnosis not present

## 2023-06-21 DIAGNOSIS — R0789 Other chest pain: Secondary | ICD-10-CM | POA: Diagnosis not present

## 2023-06-22 DIAGNOSIS — R079 Chest pain, unspecified: Secondary | ICD-10-CM | POA: Diagnosis not present

## 2023-06-22 DIAGNOSIS — R0602 Shortness of breath: Secondary | ICD-10-CM | POA: Diagnosis not present

## 2023-06-22 DIAGNOSIS — J441 Chronic obstructive pulmonary disease with (acute) exacerbation: Secondary | ICD-10-CM | POA: Diagnosis not present

## 2023-06-22 DIAGNOSIS — I2510 Atherosclerotic heart disease of native coronary artery without angina pectoris: Secondary | ICD-10-CM | POA: Diagnosis not present

## 2023-06-22 DIAGNOSIS — R0789 Other chest pain: Secondary | ICD-10-CM | POA: Diagnosis not present

## 2023-06-22 DIAGNOSIS — J449 Chronic obstructive pulmonary disease, unspecified: Secondary | ICD-10-CM | POA: Diagnosis not present

## 2023-06-22 DIAGNOSIS — R109 Unspecified abdominal pain: Secondary | ICD-10-CM | POA: Diagnosis not present

## 2023-06-27 DIAGNOSIS — I25119 Atherosclerotic heart disease of native coronary artery with unspecified angina pectoris: Secondary | ICD-10-CM | POA: Diagnosis not present

## 2023-06-27 DIAGNOSIS — F03A0 Unspecified dementia, mild, without behavioral disturbance, psychotic disturbance, mood disturbance, and anxiety: Secondary | ICD-10-CM | POA: Diagnosis not present

## 2023-06-27 DIAGNOSIS — J449 Chronic obstructive pulmonary disease, unspecified: Secondary | ICD-10-CM | POA: Diagnosis not present

## 2023-06-27 DIAGNOSIS — Z6822 Body mass index (BMI) 22.0-22.9, adult: Secondary | ICD-10-CM | POA: Diagnosis not present

## 2023-06-28 ENCOUNTER — Telehealth: Payer: Self-pay

## 2023-06-28 DIAGNOSIS — R0789 Other chest pain: Secondary | ICD-10-CM | POA: Diagnosis not present

## 2023-06-28 DIAGNOSIS — K5900 Constipation, unspecified: Secondary | ICD-10-CM | POA: Diagnosis not present

## 2023-06-28 DIAGNOSIS — J929 Pleural plaque without asbestos: Secondary | ICD-10-CM | POA: Diagnosis not present

## 2023-06-28 DIAGNOSIS — R6889 Other general symptoms and signs: Secondary | ICD-10-CM | POA: Diagnosis not present

## 2023-06-28 DIAGNOSIS — K219 Gastro-esophageal reflux disease without esophagitis: Secondary | ICD-10-CM | POA: Diagnosis not present

## 2023-06-28 DIAGNOSIS — R109 Unspecified abdominal pain: Secondary | ICD-10-CM | POA: Diagnosis not present

## 2023-06-28 DIAGNOSIS — R079 Chest pain, unspecified: Secondary | ICD-10-CM | POA: Diagnosis not present

## 2023-06-28 NOTE — Telephone Encounter (Signed)
Called to pt to schedule RH follow up- no answer/ unable to leave message.  Patient will need and OV with Dr. Vincent Gros and a referral to cardiac rehab at [facility].

## 2023-06-29 DIAGNOSIS — R079 Chest pain, unspecified: Secondary | ICD-10-CM | POA: Diagnosis not present

## 2023-06-30 NOTE — Telephone Encounter (Signed)
Pt returning nurses phone call. Please advise ?

## 2023-07-03 DIAGNOSIS — Z20822 Contact with and (suspected) exposure to covid-19: Secondary | ICD-10-CM | POA: Diagnosis not present

## 2023-07-03 DIAGNOSIS — I2510 Atherosclerotic heart disease of native coronary artery without angina pectoris: Secondary | ICD-10-CM | POA: Diagnosis not present

## 2023-07-03 DIAGNOSIS — Z7902 Long term (current) use of antithrombotics/antiplatelets: Secondary | ICD-10-CM | POA: Diagnosis not present

## 2023-07-03 DIAGNOSIS — R0602 Shortness of breath: Secondary | ICD-10-CM | POA: Diagnosis not present

## 2023-07-03 DIAGNOSIS — Z7982 Long term (current) use of aspirin: Secondary | ICD-10-CM | POA: Diagnosis not present

## 2023-07-03 DIAGNOSIS — F0390 Unspecified dementia without behavioral disturbance: Secondary | ICD-10-CM | POA: Diagnosis not present

## 2023-07-03 DIAGNOSIS — R000 Tachycardia, unspecified: Secondary | ICD-10-CM | POA: Diagnosis not present

## 2023-07-03 DIAGNOSIS — Z79899 Other long term (current) drug therapy: Secondary | ICD-10-CM | POA: Diagnosis not present

## 2023-07-03 DIAGNOSIS — J441 Chronic obstructive pulmonary disease with (acute) exacerbation: Secondary | ICD-10-CM | POA: Diagnosis not present

## 2023-07-03 DIAGNOSIS — I119 Hypertensive heart disease without heart failure: Secondary | ICD-10-CM | POA: Diagnosis not present

## 2023-07-05 ENCOUNTER — Telehealth: Payer: Self-pay

## 2023-07-05 ENCOUNTER — Encounter: Payer: Self-pay | Admitting: Internal Medicine

## 2023-07-05 NOTE — Telephone Encounter (Signed)
Transition Care Management Follow-up Telephone Call Date of discharge and from where: [facility] 9/24 How have you been since you were released from the hospital? Doing well and will be following up next week with PCP Any questions or concerns? No  Items Reviewed: Did the pt receive and understand the discharge instructions provided? Yes  Medications obtained and verified? Yes  Other? No  Any new allergies since your discharge? No  Dietary orders reviewed? No Do you have support at home? Yes     Follow up appointments reviewed:  PCP Hospital f/u appt confirmed? Yes  Scheduled to see PCP on next week @ . Specialist Hospital f/u appt confirmed?  Scheduled to see  on  @ . Are transportation arrangements needed?  If their condition worsens, is the pt aware to call PCP or go to the Emergency Dept.? Yes Was the patient provided with contact information for the PCP's office or ED? Yes Was to pt encouraged to call back with questions or concerns? Yes

## 2023-07-06 ENCOUNTER — Ambulatory Visit: Payer: Medicare HMO

## 2023-07-06 VITALS — BP 112/60 | HR 99 | Resp 16 | Ht 72.0 in | Wt 179.2 lb

## 2023-07-06 DIAGNOSIS — I2510 Atherosclerotic heart disease of native coronary artery without angina pectoris: Secondary | ICD-10-CM

## 2023-07-06 DIAGNOSIS — I25118 Atherosclerotic heart disease of native coronary artery with other forms of angina pectoris: Secondary | ICD-10-CM | POA: Diagnosis not present

## 2023-07-06 HISTORY — DX: Atherosclerotic heart disease of native coronary artery without angina pectoris: I25.10

## 2023-07-06 MED ORDER — CLOPIDOGREL BISULFATE 75 MG PO TABS: 75.0000 mg | ORAL_TABLET | Freq: Every day | ORAL | 3 refills | Status: AC

## 2023-07-06 MED ORDER — ASPIRIN 81 MG PO TBEC: 81.0000 mg | DELAYED_RELEASE_TABLET | Freq: Every day | ORAL | 3 refills | Status: DC

## 2023-07-06 NOTE — Patient Instructions (Signed)
Medication Instructions:  Your physician has recommended you make the following change in your medication:   START: Aspirin 81 mg daily START: Plavix 75 mg daily  *If you need a refill on your cardiac medications before your next appointment, please call your pharmacy*   Lab Work: None If you have labs (blood work) drawn today and your tests are completely normal, you will receive your results only by: MyChart Message (if you have MyChart) OR A paper copy in the mail If you have any lab test that is abnormal or we need to change your treatment, we will call you to review the results.   Testing/Procedures: None   Follow-Up: At [facility], you and your health needs are our priority.  As part of our continuing mission to provide you with exceptional heart care, we have created designated Provider Care Teams.  These Care Teams include your primary Cardiologist (physician) and Advanced Practice Providers (APPs -  Physician Assistants and Nurse Practitioners) who all work together to provide you with the care you need, when you need it.  We recommend signing up for the patient portal called "MyChart".  Sign up information is provided on this After Visit Summary.  MyChart is used to connect with patients for Virtual Visits (Telemedicine).  Patients are able to view lab/test results, encounter notes, upcoming appointments, etc.  Non-urgent messages can be sent to your provider as well.   To learn more about what you can do with MyChart, go to [email or web address].    Your next appointment:   1 month(s)  Provider:   Huntley Dec, MD    Other Instructions None

## 2023-07-06 NOTE — Assessment & Plan Note (Signed)
No active symptoms at this time. Able to do his day-to-day activities, CCS class II.  Given recent NSTEMI and unsuccessful PCI attempt, continue with dual antiplatelet therapy. Continue aspirin 81 mg once daily for life. Continue Plavix 75 mg once daily for at least 6 months. He did not bring his medications today. His daughter made a note of these 2 medications. I will send prescription out for these 2 medicines so he has enough supply.  Would recommend continuing atorvastatin 80 mg once daily  Will review him at follow-up visit in 1 month and advised him to bring all his medications to the next visit.  He should also be on metoprolol tartrate 12.5 mg twice daily. Will further escalate antianginal regimen depending on his symptoms.

## 2023-07-06 NOTE — Progress Notes (Signed)
Cardiology Consultation:    Date:  07/06/2023   ID:  Daniel Tran, DOB Apr 19, 1946, MRN [id number]  PCP:  Abner Greenspan, MD  Cardiologist:  Marlyn Corporal Jacquita Mulhearn, MD   Referring MD: Abner Greenspan, MD   Chief Complaint  Patient presents with   Follow-up     ASSESSMENT AND PLAN:   [name] 77 year old with history of CAD s/p NSTEMI and cardiac cath May 23, 2023 with subtotal occlusion of RCA, unsuccessful PCI and he declined further attempts with orbital atherectomy and being managed medically, hypertension, smoking, COPD, blind in right eye, mild dementia, poor insight. Baseline functional status appears stable, not complaining of any worsening anginal symptoms, no signs of fluid overload.  Problem List Items Addressed This Visit     CAD, S/p NSTEMI September 2024 05/23/2023 at [facility], subtotal occlusion of RCA, unsuccessful PCI - Primary    No active symptoms at this time. Able to do his day-to-day activities, CCS class II.  Given recent NSTEMI and unsuccessful PCI attempt, continue with dual antiplatelet therapy. Continue aspirin 81 mg once daily for life. Continue Plavix 75 mg once daily for at least 6 months. He did not bring his medications today. His daughter made a note of these 2 medications. I will send prescription out for these 2 medicines so he has enough supply.  Would recommend continuing atorvastatin 80 mg once daily  Will review him at follow-up visit in 1 month and advised him to bring all his medications to the next visit.  He should also be on metoprolol tartrate 12.5 mg twice daily. Will further escalate antianginal regimen depending on his symptoms.        Relevant Medications   aspirin EC 81 MG tablet   Other Relevant Orders   EKG 12-Lead (Completed)   Return to clinic in 1 month, recommended to bring along family member who manages his medications and also bring all his medication list along.  His daughter also mentions she will make an  updated list of his medications at home and drop it by our office tomorrow.   History of Present Illness:    Daniel Tran is a 77 y.o. male who is being seen today for establishing care here at the office. He was recently seen by my colleague Dr. Bing Matter at [facility]. His PCP is Abner Greenspan, MD.  Here for the visit accompanied by his daughter. He lives by himself at home and his daughters help him out.  He manages his medications by himself.  History of CAD s/p NSTEMI cardiac cath 05/23/2023, Showed subtotal occlusion of ostial RCA with distal left-to-right collaterals, moderate LAD and LCx disease unsuccessful attempts at PCI of RCA, orbital atherectomy offered but he declined and continues on medical management with dual antiplatelet therapy], hypertension, smoking, COPD, blind in right eye, mild dementia, poor insight into his health issues.  He was recently admitted and treated inpatient between June 20, 2023 through June 22, 2023 at [facility] for atypical chest pain and abdominal discomfort, but flat troponin I 0.14 and 0.15.  There was significant constipation.  Symptoms improved after his bowel movements normalized.  There was concern about medication adherence at home with memory and cognitive decline recently.  He had another visit to the emergency room on October 27 for symptoms of shortness of breath, was hemodynamically stable, he was treated for possible acute COPD exacerbation.  Here today accompanied by his daughter.  Mentions he has been doing fairly well.  Denies  any chest pain.  On Sunday he was having shortness of breath but this has improved and he has not been feeling sore anymore.  He denies any limitations with grocery shopping.  Able to walk in [employer] without any problem.  Denies any lightheadedness, dizziness or syncopal episodes.  Patient has been taking medications as prescribed.  He did not bring an updated medication list from  home. His current medication list available in our system do not list any antiplatelet agents, statins or any of the cardiovascular medications. He mentions taking aspirin and Plavix at home although this was not confirmed.  Discharge medicine from [facility] recently noted atorvastatin 80 mg once daily, metoprolol 12.5 mg twice daily, aspirin 81 mg once daily, clopidogrel 75 mg once daily.   EKG in the clinic today shows sinus rhythm heart rate 97/min, incomplete RBBB with QRS duration 96 ms.  Lipid panel denies 15-20 24 noted total cholesterol 156, LDL 47, HDL 93, triglycerides 80 Last blood work from 07-03-2023 hemoglobin 11.2, hematocrit 33.3, WBC 6.2, platelets 236 Sodium 140, potassium 3.6, BUN 14, creatinine 0.8, glucose 180   Last echocardiogram for comparison is from 05-16-2023 at [facility], LVEF 60 to 65%, normal wall motion, no significant valvular stenosis or regurgitation.  Past Medical History:  Diagnosis Date   COPD (chronic obstructive pulmonary disease) (HCC)    Glaucoma    Hypertension, benign    Pulmonary nodule, left 12/21/2017   CT 2016 LLL 6mm   Seasonal allergic rhinitis     Past Surgical History:  Procedure Laterality Date   EYE SURGERY Bilateral 04/11/2019   Ectropion repair by lateral tarsal strip & qickert bilateral eyelids & punctoplasty bilateral lower puncta   EYE SURGERY Bilateral 04/11/2019   Snip incision of lacrimal punctum; Surgeon: Elmarie Shiley, MD; Location: [facility]   HERNIA REPAIR     inguinal    Current Medications: Current Meds  Medication Sig   acetylcysteine (MUCOMYST) 20 % nebulizer solution Take 4 mLs by nebulization every 4 (four) hours.   albuterol (PROVENTIL) (2.5 MG/3ML) 0.083% nebulizer solution Take 2.5 mg by nebulization every 6 (six) hours as needed for wheezing or shortness of breath.   albuterol (VENTOLIN HFA) 108 (90 Base) MCG/ACT inhaler Inhale 1-2 puffs into the lungs every 4  (four) hours as needed for wheezing or shortness of breath.   aspirin EC 81 MG tablet Take 1 tablet (81 mg total) by mouth daily. Swallow whole.   clopidogrel (PLAVIX) 75 MG tablet Take 1 tablet (75 mg total) by mouth daily.   doxycycline (VIBRAMYCIN) 100 MG capsule Take 100 mg by mouth daily.   fluticasone (FLONASE) 50 MCG/ACT nasal spray Place 2 sprays into both nostrils daily.   Fluticasone-Umeclidin-Vilant (TRELEGY ELLIPTA) 100-62.5-25 MCG/ACT AEPB Inhale 1 puff into the lungs daily.   ipratropium-albuterol (DUONEB) 0.5-2.5 (3) MG/3ML SOLN Take 3 mLs by nebulization every 6 (six) hours as needed (shortness of breath).   predniSONE (DELTASONE) 20 MG tablet Take 20 mg by mouth daily with breakfast.   timolol (TIMOPTIC) 0.5 % ophthalmic solution Place 1 drop into the left eye 2 (two) times daily.     Allergies:   Patient has no known allergies.   Social History   Socioeconomic History   Marital status: Married    Spouse name: Not on file   Number of children: Not on file   Years of education: Not on file   Highest education level: Not on file  Occupational History  Not on file  Tobacco Use   Smoking status: Former    Current packs/day: 2.00    Average packs/day: 2.0 packs/day for 60.0 years (120.0 ttl pk-yrs)    Types: Cigarettes, Cigars   Smokeless tobacco: Never   Tobacco comments:    smokes 3 cigarettes/day 06/08/2018  Vaping Use   Vaping status: Never Used  Substance and Sexual Activity   Alcohol use: Not Currently   Drug use: Never   Sexual activity: Not on file  Other Topics Concern   Not on file  Social History Narrative   Not on file   Social Determinants of Health   Financial Resource Strain: Not on file  Food Insecurity: Low Risk  (05/15/2023)   Received from [facility]   Hunger Vital Sign    Worried About Running Out of Food in the Last Year: Never true    Ran Out of Food in the Last Year: Never true  Transportation Needs: No Transportation Needs  (05/15/2023)   Received from [employer]    In the past 12 months, has lack of reliable transportation kept you from medical appointments, meetings, work or from getting things needed for daily living? : No  Physical Activity: Not on file  Stress: Not on file  Social Connections: Not on file     Family History: The patient's family history is not on file. ROS:   Please see the history of present illness.    All 14 point review of systems negative except as described per history of present illness.  EKGs/Labs/Other Studies Reviewed:    The following studies were reviewed today:   EKG:  EKG Interpretation Date/Time:  Wednesday July 06 2023 13:08:35 EDT Ventricular Rate:  97 PR Interval:  112 QRS Duration:  96 QT Interval:  328 QTC Calculation: 416 R Axis:   -19  Text Interpretation: Normal sinus rhythm Incomplete right bundle branch block When compared with ECG of 06-Aug-2021 20:16, No significant change was found Confirmed by Huntley Dec reddy [phone]) on 07/06/2023 1:12:54 PM    Recent Labs: No results found for requested labs within last 365 days.  Recent Lipid Panel No results found for: "CHOL", "TRIG", "HDL", "CHOLHDL", "VLDL", "LDLCALC", "LDLDIRECT"  Cardiac Cath 05/23/2023 at [facility]:: This result has an attachment that is not available.    Chronic subtotal occlusion of ostial RCA with left-to-right collaterals  - PCI attempted but unsuccessful due to inability to cross with any  balloon.   Moderate disease of LAD and Circumflex.    Normal LVEDP and LV-Ao gradient    Will discuss possible high-risk/complex PCI of the ostial RCA with  atherectomy tomorrow.    Ost RCA lesion is 99% stenosed, reduced to 99%.   PERFORMING CARDIOLOGIST: Wilkie Aye, MD   INDICATIONS:  NSTEMI within the last 7 days   ACCESS SITE: right radial artery   PROCEDURE:  Coronary angiography, Left heart cath, angioplasty of ostial  RCA (unsuccessful)   DIAGNOSTIC  FINDINGS:    LM: ectatic and widely patent  LAD: ectatic calcified proximal segment supplies large D1; smaller D2 has  borderline ostial disease; D3 has 50% ostial disease; after D3, there is  diffuse moderate disease in mid to distal segment after D3. The apical  vessel is tortuous and supplies left to right collaterals.  LCX: 40% ostial; 40% mid after an OM1 with mild disease; 60% mid-distal.  Supplies left to right collaterals.  RCA: 99% calcified ostial stenosis with TIMI 1-2 flow, which appears  to be  partially due to competitive collateral filling.   INTERVENTION:    Attempted angioplasty of the ostial RCA with 1.0 and 1.6mm Sapphire  balloons. Able to wire, but could not cross with even ultra-low profile  balloons - angioplasty performed with balloons partially wedged into  lesion to facilitate crossing, but still unsuccessful. Would likely  require atherectomy. (See procedural details below) Physical Exam:    VS:  BP 112/60 (BP Location: Left Arm, Patient Position: Sitting, Cuff Size: Normal)   Pulse 99   Resp 16   Ht 6' (1.829 m)   Wt 179 lb 3.2 oz (81.3 kg)   BMI 24.30 kg/m     Wt Readings from Last 3 Encounters:  07/06/23 179 lb 3.2 oz (81.3 kg)  07/31/21 161 lb 7 oz (73.2 kg)  04/30/19 157 lb 6.4 oz (71.4 kg)     GENERAL:  Well nourished, well developed in no acute distress NECK: No JVD; No carotid bruits CARDIAC: RRR, S1 and S2 present, no murmurs, no rubs, no gallops CHEST:  Clear to auscultation without rales, wheezing or rhonchi  Extremities: No pitting pedal edema. Pulses bilaterally symmetric with radial 2+ and dorsalis pedis 2+ NEUROLOGIC:  Alert and oriented x 3  Medication Adjustments/Labs and Tests Ordered: Current medicines are reviewed at length with the patient today.  Concerns regarding medicines are outlined above.  Orders Placed This Encounter  Procedures   EKG 12-Lead   Meds ordered this encounter  Medications   aspirin EC 81 MG tablet     Sig: Take 1 tablet (81 mg total) by mouth daily. Swallow whole.    Dispense:  90 tablet    Refill:  3   clopidogrel (PLAVIX) 75 MG tablet    Sig: Take 1 tablet (75 mg total) by mouth daily.    Dispense:  90 tablet    Refill:  3    Signed, Kealey Kemmer reddy Mialani Reicks, MD, MPH, [facility]. 07/06/2023 2:00 PM    [facility]

## 2023-07-08 DIAGNOSIS — I517 Cardiomegaly: Secondary | ICD-10-CM | POA: Diagnosis not present

## 2023-07-08 DIAGNOSIS — J9610 Chronic respiratory failure, unspecified whether with hypoxia or hypercapnia: Secondary | ICD-10-CM | POA: Diagnosis not present

## 2023-07-08 DIAGNOSIS — I510 Cardiac septal defect, acquired: Secondary | ICD-10-CM | POA: Diagnosis not present

## 2023-07-08 DIAGNOSIS — Z743 Need for continuous supervision: Secondary | ICD-10-CM | POA: Diagnosis not present

## 2023-07-08 DIAGNOSIS — R9431 Abnormal electrocardiogram [ECG] [EKG]: Secondary | ICD-10-CM | POA: Diagnosis not present

## 2023-07-08 DIAGNOSIS — I499 Cardiac arrhythmia, unspecified: Secondary | ICD-10-CM | POA: Diagnosis not present

## 2023-07-08 DIAGNOSIS — R069 Unspecified abnormalities of breathing: Secondary | ICD-10-CM | POA: Diagnosis not present

## 2023-07-08 DIAGNOSIS — R000 Tachycardia, unspecified: Secondary | ICD-10-CM | POA: Diagnosis not present

## 2023-07-08 DIAGNOSIS — J441 Chronic obstructive pulmonary disease with (acute) exacerbation: Secondary | ICD-10-CM | POA: Diagnosis not present

## 2023-07-11 DIAGNOSIS — R635 Abnormal weight gain: Secondary | ICD-10-CM | POA: Diagnosis not present

## 2023-07-11 DIAGNOSIS — R1013 Epigastric pain: Secondary | ICD-10-CM | POA: Diagnosis not present

## 2023-07-11 DIAGNOSIS — R600 Localized edema: Secondary | ICD-10-CM | POA: Diagnosis not present

## 2023-07-11 DIAGNOSIS — R0602 Shortness of breath: Secondary | ICD-10-CM | POA: Diagnosis not present

## 2023-07-11 DIAGNOSIS — Z6825 Body mass index (BMI) 25.0-25.9, adult: Secondary | ICD-10-CM | POA: Diagnosis not present

## 2023-07-12 ENCOUNTER — Other Ambulatory Visit: Payer: Self-pay

## 2023-07-12 ENCOUNTER — Telehealth: Payer: Self-pay | Admitting: *Deleted

## 2023-07-12 DIAGNOSIS — R062 Wheezing: Secondary | ICD-10-CM | POA: Diagnosis not present

## 2023-07-12 DIAGNOSIS — Z748 Other problems related to care provider dependency: Secondary | ICD-10-CM

## 2023-07-12 DIAGNOSIS — R0602 Shortness of breath: Secondary | ICD-10-CM | POA: Diagnosis not present

## 2023-07-12 NOTE — Progress Notes (Signed)
  Care Coordination  Outreach Note  07/12/2023 Name: Daniel Tran MRN: [id number] DOB: 04-Mar-1946   Care Coordination Outreach Attempts: An unsuccessful telephone outreach was attempted today to offer the patient information about available care coordination services.  Follow Up Plan:  Additional outreach attempts will be made to offer the patient care coordination information and services.   Encounter Outcome:  No Answer  Burman Nieves, CCMA Care Coordination Care Guide Direct Dial: [phone]

## 2023-07-13 DIAGNOSIS — H401123 Primary open-angle glaucoma, left eye, severe stage: Secondary | ICD-10-CM | POA: Diagnosis not present

## 2023-07-13 DIAGNOSIS — Z961 Presence of intraocular lens: Secondary | ICD-10-CM | POA: Diagnosis not present

## 2023-07-14 ENCOUNTER — Other Ambulatory Visit: Payer: Self-pay

## 2023-07-14 DIAGNOSIS — R0602 Shortness of breath: Secondary | ICD-10-CM | POA: Diagnosis not present

## 2023-07-14 DIAGNOSIS — I351 Nonrheumatic aortic (valve) insufficiency: Secondary | ICD-10-CM

## 2023-07-14 NOTE — Progress Notes (Signed)
  Care Coordination  Outreach Note  07/14/2023 Name: Reinhardt Licausi MRN: [id number] DOB: Jan 15, 1946   Care Coordination Outreach Attempts: A second unsuccessful outreach was attempted today to offer the patient with information about available care coordination services.  Follow Up Plan:  Additional outreach attempts will be made to offer the patient care coordination information and services.   Encounter Outcome:  No Answer  Burman Nieves, CCMA Care Coordination Care Guide Direct Dial: [phone]

## 2023-07-15 NOTE — Progress Notes (Signed)
  Care Coordination   Note   07/15/2023 Name: [name] MRN: [id number] DOB: September 21, 1945  Daniel Tran is a 77 y.o. year old male who sees Abner Greenspan, MD for primary care. I reached out to Eulis Canner by phone today to offer care coordination services.  [name] was given information about Care Coordination services today including:   The Care Coordination services include support from the care team which includes your Nurse Coordinator, Clinical Social Worker, or Pharmacist.  The Care Coordination team is here to help remove barriers to the health concerns and goals most important to you. Care Coordination services are voluntary, and the patient may decline or stop services at any time by request to their care team member.   Care Coordination Consent Status: Patient agreed to services and verbal consent obtained.   Follow up plan:  Telephone appointment with care coordination team member scheduled for:  07/19/2023  Encounter Outcome:  Patient Scheduled from referral   Burman Nieves, [facility] Care Coordination Care Guide Direct Dial: [phone]

## 2023-07-18 DIAGNOSIS — Z79899 Other long term (current) drug therapy: Secondary | ICD-10-CM | POA: Diagnosis not present

## 2023-07-18 DIAGNOSIS — Z6823 Body mass index (BMI) 23.0-23.9, adult: Secondary | ICD-10-CM | POA: Diagnosis not present

## 2023-07-18 DIAGNOSIS — M542 Cervicalgia: Secondary | ICD-10-CM | POA: Diagnosis not present

## 2023-07-19 ENCOUNTER — Ambulatory Visit: Payer: Self-pay | Admitting: *Deleted

## 2023-07-19 ENCOUNTER — Encounter: Payer: Self-pay | Admitting: *Deleted

## 2023-07-19 NOTE — Patient Outreach (Signed)
Care Coordination   Initial Visit Note   07/20/2023 Name: Daniel Tran MRN: [id number] DOB: 1946/02/11  Daniel Tran is a 77 y.o. year old male who sees Daniel Greenspan, MD for primary care. I spoke with  Daniel Tran by phone today.  What matters to the patients health and wellness today?  Pt reports he does well at home living alone. Has local daughters who help as needed; however, "I drive myself most of the time.... do my own laundry down in the basement and stay busy with church, bible study, and other activities. Declines the need for facility placement.    Goals Addressed             This Visit's Progress    Consider community resources to enhance and offer additional support       Activities and task to complete in order to accomplish goals.   Call your insurance provider for more information about your Enhanced Benefits including the utility/food/over the counter card Follow up on community resources discussed ([facility], [place] [facility], ) Begin your outpatient PT rehab and consider other activities/programs discussed today Complete Advance Directive packet (will mail to you)  Have advance directive notarized and provide a copy to provider office  Expect phone call from Pharmacy team-          SDOH assessments and interventions completed:  Yes  SDOH Interventions Today    Flowsheet Row Most Recent Value  SDOH Interventions   Food Insecurity Interventions Intervention Not Indicated  Housing Interventions Intervention Not Indicated  Transportation Interventions Intervention Not Indicated  [Pt reports he drives some and his children help with rides if needed]  Utilities Interventions Intervention Not Indicated  Alcohol Usage Interventions Intervention Not Indicated (Score <7)  Financial Strain Interventions Intervention Not Indicated  Physical Activity Interventions Patient Declined, Local [employer]  Social Connections Interventions Intervention Not Indicated  Health Literacy  Interventions Intervention Not Indicated        Care Coordination Interventions:  Yes, provided  Interventions Today    Flowsheet Row Most Recent Value  Chronic Disease   Chronic disease during today's visit Other  [recent NSTEMI]  General Interventions   General Interventions Discussed/Reviewed Level of Care  [CSW discussed possible facility placement with pt to which is adamant he does not want or need.]  Exercise Interventions   Exercise Discussed/Reviewed Exercise Discussed, Exercise Reviewed, Physical Activity  [Encouraged pt to consider the local Y or Senior Center for physical activities as well.]  Physical Activity Discussed/Reviewed Physical Activity Discussed, Gym, Types of exercise  [Pt will begin outpatient PT at [facility] soon- pt not sure of start date- provided phone # to him to call and inquire.  Pt has been an active person,  was awaiting Cardiology clearance to begin PT. He also stays busy doing house chores, church, etc]  Mental Health Interventions   Mental Health Discussed/Reviewed Mental Health Discussed  [Pt denies depression.]  Nutrition Interventions   Nutrition Discussed/Reviewed Nutrition Discussed  [Pt reports he drives to get groceries and other things- no concerns voiced]  Pharmacy Interventions   Pharmacy Dicussed/Reviewed Pharmacy Topics Discussed  [Pt reports he was told by PCP office this week that all his meds will now be coming from [facility] which is near his home. Pt denies any concerns related to paying or obtaining his meds. PCP referral mentioned Med Compliance- Pharmacy ream consulter]  Safety Interventions   Safety Discussed/Reviewed Safety Discussed  [Pt  reports living alone-lost his wife in January- has  4 daughter's nearby who check in and help as needed. Pt drives some and also can rely on children to take him if he does not feel up to it.]  Advanced Directive Interventions   Advanced Directives Discussed/Reviewed Advanced Directives  Discussed, Provided resource for acquiring and filling out documents  [Educated pt on HCPOA and he is interested in completing. Will mail packet to him. He indicates his son in [place] is a Chartered loss adjuster" and will likely choose him.]       Follow up plan: Follow up call scheduled for 08/08/23    Encounter Outcome:  Patient Visit Completed

## 2023-07-19 NOTE — Patient Instructions (Signed)
Visit Information  Thank you for taking time to visit with me today. Please don't hesitate to contact me if I can be of assistance to you.   Following are the goals we discussed today:   Goals Addressed             This Visit's Progress    Consider community resources provided to enhance and offer additional support       Activities and task to complete in order to accomplish goals.   Call your insurance provider for more information about your Enhanced Benefits including the utility/food/over the counter card Follow up on community resources discussed ([facility], [place] [facility], ) Begin your outpatient PT rehab and consider other activities/programs discussed today Complete Advance Directive packet (will mail to you)  Have advance directive notarized and provide a copy to provider office  Expect phone call from Pharmacy team-          Our next appointment is by telephone on 08/08/23   Please call the care guide team at [phone] if you need to cancel or reschedule your appointment.   If you are experiencing a Mental Health or Behavioral Health Crisis or need someone to talk to, please call the Suicide and Crisis Lifeline: 988 call 911   The patient verbalized understanding of instructions, educational materials, and care plan provided today and DECLINED offer to receive copy of patient instructions, educational materials, and care plan.   Telephone follow up appointment with care management team member scheduled for: 08/08/23 Reece Levy, MSW, LCSW [place]  [facility], [facility] Licensed Clinical Social Worker Care Coordinator  [phone]

## 2023-07-26 ENCOUNTER — Telehealth: Payer: Self-pay

## 2023-07-26 DIAGNOSIS — I252 Old myocardial infarction: Secondary | ICD-10-CM | POA: Diagnosis not present

## 2023-07-26 DIAGNOSIS — J449 Chronic obstructive pulmonary disease, unspecified: Secondary | ICD-10-CM | POA: Diagnosis not present

## 2023-07-26 DIAGNOSIS — I10 Essential (primary) hypertension: Secondary | ICD-10-CM | POA: Diagnosis not present

## 2023-07-26 DIAGNOSIS — Z955 Presence of coronary angioplasty implant and graft: Secondary | ICD-10-CM | POA: Diagnosis not present

## 2023-07-26 NOTE — Telephone Encounter (Signed)
Records faxed.

## 2023-07-26 NOTE — Telephone Encounter (Signed)
Cardiac Rehab calling because they received referral but not records. Please fax records to [phone]

## 2023-07-27 DIAGNOSIS — Z955 Presence of coronary angioplasty implant and graft: Secondary | ICD-10-CM | POA: Diagnosis not present

## 2023-07-27 DIAGNOSIS — J449 Chronic obstructive pulmonary disease, unspecified: Secondary | ICD-10-CM | POA: Diagnosis not present

## 2023-07-27 DIAGNOSIS — I10 Essential (primary) hypertension: Secondary | ICD-10-CM | POA: Diagnosis not present

## 2023-07-27 DIAGNOSIS — I252 Old myocardial infarction: Secondary | ICD-10-CM | POA: Diagnosis not present

## 2023-07-29 DIAGNOSIS — J449 Chronic obstructive pulmonary disease, unspecified: Secondary | ICD-10-CM | POA: Diagnosis not present

## 2023-07-29 DIAGNOSIS — I252 Old myocardial infarction: Secondary | ICD-10-CM | POA: Diagnosis not present

## 2023-07-29 DIAGNOSIS — I10 Essential (primary) hypertension: Secondary | ICD-10-CM | POA: Diagnosis not present

## 2023-07-29 DIAGNOSIS — Z955 Presence of coronary angioplasty implant and graft: Secondary | ICD-10-CM | POA: Diagnosis not present

## 2023-08-01 ENCOUNTER — Other Ambulatory Visit: Payer: Self-pay

## 2023-08-01 DIAGNOSIS — H409 Unspecified glaucoma: Secondary | ICD-10-CM | POA: Insufficient documentation

## 2023-08-01 DIAGNOSIS — Z955 Presence of coronary angioplasty implant and graft: Secondary | ICD-10-CM | POA: Diagnosis not present

## 2023-08-01 DIAGNOSIS — I252 Old myocardial infarction: Secondary | ICD-10-CM | POA: Diagnosis not present

## 2023-08-01 DIAGNOSIS — I499 Cardiac arrhythmia, unspecified: Secondary | ICD-10-CM | POA: Diagnosis not present

## 2023-08-01 DIAGNOSIS — J302 Other seasonal allergic rhinitis: Secondary | ICD-10-CM | POA: Insufficient documentation

## 2023-08-01 DIAGNOSIS — R0602 Shortness of breath: Secondary | ICD-10-CM | POA: Diagnosis not present

## 2023-08-01 DIAGNOSIS — I10 Essential (primary) hypertension: Secondary | ICD-10-CM | POA: Insufficient documentation

## 2023-08-01 DIAGNOSIS — I700 Atherosclerosis of aorta: Secondary | ICD-10-CM | POA: Diagnosis not present

## 2023-08-01 DIAGNOSIS — I5023 Acute on chronic systolic (congestive) heart failure: Secondary | ICD-10-CM | POA: Diagnosis not present

## 2023-08-01 DIAGNOSIS — Z743 Need for continuous supervision: Secondary | ICD-10-CM | POA: Diagnosis not present

## 2023-08-01 DIAGNOSIS — R600 Localized edema: Secondary | ICD-10-CM | POA: Diagnosis not present

## 2023-08-01 DIAGNOSIS — J449 Chronic obstructive pulmonary disease, unspecified: Secondary | ICD-10-CM | POA: Diagnosis not present

## 2023-08-01 DIAGNOSIS — W19XXXA Unspecified fall, initial encounter: Secondary | ICD-10-CM | POA: Diagnosis not present

## 2023-08-01 DIAGNOSIS — R079 Chest pain, unspecified: Secondary | ICD-10-CM | POA: Diagnosis not present

## 2023-08-02 ENCOUNTER — Ambulatory Visit: Payer: Medicare HMO

## 2023-08-02 VITALS — BP 131/79 | HR 110 | Ht 72.0 in | Wt 176.0 lb

## 2023-08-02 DIAGNOSIS — I25118 Atherosclerotic heart disease of native coronary artery with other forms of angina pectoris: Secondary | ICD-10-CM | POA: Diagnosis not present

## 2023-08-02 DIAGNOSIS — I351 Nonrheumatic aortic (valve) insufficiency: Secondary | ICD-10-CM

## 2023-08-02 DIAGNOSIS — I10 Essential (primary) hypertension: Secondary | ICD-10-CM

## 2023-08-02 DIAGNOSIS — R600 Localized edema: Secondary | ICD-10-CM | POA: Insufficient documentation

## 2023-08-02 DIAGNOSIS — I4510 Unspecified right bundle-branch block: Secondary | ICD-10-CM | POA: Diagnosis not present

## 2023-08-02 DIAGNOSIS — J432 Centrilobular emphysema: Secondary | ICD-10-CM

## 2023-08-02 HISTORY — DX: Nonrheumatic aortic (valve) insufficiency: I35.1

## 2023-08-02 HISTORY — DX: Localized edema: R60.0

## 2023-08-02 NOTE — Assessment & Plan Note (Signed)
Tentatively follow-up echocardiogram in about 1 year.

## 2023-08-02 NOTE — Progress Notes (Signed)
Cardiology Consultation:    Date:  08/02/2023   ID:  Daniel Tran, DOB 02-25-1946, MRN [id number]  PCP:  Daniel Greenspan, MD  Cardiologist:  Daniel Corporal Kamilya Wakeman, MD   Referring MD: Daniel Greenspan, MD   Chief Complaint  Patient presents with   Follow-up     ASSESSMENT AND PLAN:   [name] 78 year old male patient of follow-up visit with history of CAD s/p NSTEMI recently September 2024 with cath showing subtotal occlusion of ostial RCA with distal left-to-right collaterals and moderate LAD and LCx disease unsuccessful PCI attempts of RCA HISTORY of hypertension, mild TR, mild aortic insufficiency, smoking, COPD, mild dementia, blind in right eye, poor insight into his overall situation.  Lives at home by himself and has his medications filled by his PCPs office on a weekly basis that he picks up on Tuesdays. Here for follow-up visit today and had overnight stay at the ER at [facility] for shortness of breath and ankle edema associated with wheezing, symptoms improved with multiple albuterol treatments.  Problem List Items Addressed This Visit     COPD (chronic obstructive pulmonary disease) (HCC)    Follows up with Dr.Chodri for pulmonology. Reports he has been told about continuing on prednisone, however currently is not on steroids. I anticipate he was given steroids for acute COPD exacerbation.  Mentions he was wheezing acutely yesterday at the time of his ER visit and resolved with multiple treatments with albuterol inhalers in the ER.  Advised to reach out to his pulmonologist for further follow-up.      CAD, S/p NSTEMI September 2024 05/23/2023 at [facility], subtotal occlusion of RCA, unsuccessful PCI - Primary    Stable symptoms. No acute worsening. Functional status limited due to comorbidities, appears stable at CCS class II. Continue with dual antiplatelet therapy aspirin 81 mg once daily. Continue Plavix 75 mg once daily tentatively until March  2025.  Continue with atorvastatin 80 mg once daily. Currently on metoprolol tartrate 12.5 mg twice daily.        Hypertension, benign    Well-controlled. Continue current medications metoprolol tartrate 12.5 mg twice daily. Target below 130 over 80 mmHg.       Bilateral lower extremity edema    , No pain or discomfort. Appears dependent edema, in the setting of diastolic dysfunction, poor dietary habits with excess salt consumption.  Would recommend low-salt diet discussed various dietary options and ways to reduce salt intake. He is willing to consider preparing meals at home and avoiding salty foods.  Continue with current Lasix dose furosemide 20 mg once daily.       Mild aortic insufficiency, by TTE 07/14/2023 at [facility]    Tentatively follow-up echocardiogram in about 1 year.      He is aware and is directed to go to his PCPs office after the visit with [place] today to go pick up his prescribed medications. Return to follow-up with [place] in 3 months  History of Present Illness:    Daniel Tran is a 77 y.o. male who is being seen today for follow-up visit. Last visit with me in the office was 07/06/2023 PCP is Daniel Greenspan, MD.   Has history of CAD s/p NSTEMI cardiac cath 05/23/2023 with subtotal occlusion of ostial RCA with distal left-to-right collaterals, moderate LAD and LCx disease, unsuccessful attempts at PCI of RCA.  He declined orbital atherectomy offered and continued on medical therapy. Also has history of hypertension, mild TR and mild aortic  insufficiency, smoking, COPD, blind in right eye, mild dementia, poor insight into his overall health situation and lives alone at home, his daughter helps him out.  He has medications filled through his PCPs office every Tuesday and he takes them as packaged.  Subsequently had visits to the ER in October for abdominal discomfort in the setting of constipation and later for shortness of breath in the setting of  COPD exacerbation. At last visit with [place] he did not bring his medications and there was confusion about what exact medications he has been taking at home.  We asked to return for follow-up visit and recommended bringing his medications and any family member available in his care to the visit.   He follows up today and mentions he just was sent home from ER at [facility] this morning. He mentions he was there for leg swelling and shortness of breath associated with wheezing. Labs from overnight reviewed as below. Chest x-ray was reported to be unremarkable.  EKG sinus rhythm with RBBB morphology.  Mentions today his breathing feels better at discharge from the ER. Denies any chest pain.  Mentions his bilateral lower extremity edema is mostly limited to the ankles and feet.  Not associated with any pain.  Edema goals down slightly with having his feet up. No syncopal episodes.  Denies any palpitations.  He admits his diet usually involves having [place] food and ribs at [employer].  Mentions he does like soups but does not prepare them at home.   Last echocardiogram available to review from 07/14/2023 at [facility] noted LVEF 55 to 60%, normal RV size and function, grade 1 diastolic dysfunction.  Normal biatrial size.  Mild aortic insufficiency and mild tricuspid regurgitation. echocardiogram in September 2024 at [facility] noted LVEF 60 to 65%, normal wall motion and no significant valve abnormalities.   Blood work done in the ER overnight at [facility] 08/01/2023 and this morning 08/02/2023 reviewed CMP this morning shows sodium 140, potassium 3.9, BUN 15, creatinine 0.94, EGFR 82 Total protein was 6, albumin 3.6. Alkaline phosphatase mildly elevated 114 ALT mildly elevated 55. AST normal 37 High-sensitivity troponin 13 and 12 BNP mildly elevated 196 [in comparison this was 134 on 06/28/2023]. CBC noted  hemoglobin 10.7, hematocrit 32.3, platelets 283, WBC 5.9 Respiratory virus screen negative for influenza, RSV, SARS-CoV-2  Past Medical History:  Diagnosis Date   CAD, S/p NSTEMI September 2024 05/23/2023 at [facility], subtotal occlusion of RCA, unsuccessful PCI 07/06/2023   COPD (chronic obstructive pulmonary disease) (HCC)    Elevated troponin 05/15/2023   Glaucoma    Hypertension, benign    Macrocytosis 05/21/2023   NSTEMI (non-ST elevated myocardial infarction) (HCC) 05/22/2023   On home oxygen therapy 05/15/2023   Pulmonary nodule, left 12/21/2017   CT 2016 LLL 6mm   Respiratory failure (HCC) 05/20/2023   Seasonal allergic rhinitis    Thrush 05/19/2023   Tobacco abuse 12/21/2017    Past Surgical History:  Procedure Laterality Date   EYE SURGERY Bilateral 04/11/2019   Ectropion repair by lateral tarsal strip & qickert bilateral eyelids & punctoplasty bilateral lower puncta   EYE SURGERY Bilateral 04/11/2019   Snip incision of lacrimal punctum; Surgeon: Elmarie Shiley, MD; Location: [facility]   HERNIA REPAIR     inguinal    Current Medications: Current Meds  Medication Sig   acetaminophen (TYLENOL) 650 MG CR tablet Take 650 mg by mouth every 8 (  eight) hours as needed for pain.   albuterol (PROVENTIL) (2.5 MG/3ML) 0.083% nebulizer solution Take 2.5 mg by nebulization every 6 (six) hours as needed for wheezing or shortness of breath.   albuterol (VENTOLIN HFA) 108 (90 Base) MCG/ACT inhaler Inhale 1-2 puffs into the lungs every 4 (four) hours as needed for wheezing or shortness of breath.   aspirin 81 MG chewable tablet Chew 81 mg by mouth daily.   atorvastatin (LIPITOR) 80 MG tablet Take 80 mg by mouth daily.   B-12 MICROLOZENGE 500 MCG SUBL Take 2 tablets by mouth daily.   brinzolamide (AZOPT) 1 % ophthalmic suspension Place 1 drop into the left eye 3 (three) times daily.   clopidogrel (PLAVIX) 75 MG tablet Take 1 tablet (75 mg total) by mouth daily.   dorzolamide-timolol  (COSOPT) 2-0.5 % ophthalmic solution Place 1 drop into the left eye 2 (two) times daily.   Fluticasone-Umeclidin-Vilant (TRELEGY ELLIPTA) 100-62.5-25 MCG/ACT AEPB Inhale 1 puff into the lungs daily.   furosemide (LASIX) 20 MG tablet Take 20 mg by mouth daily.   gabapentin (NEURONTIN) 300 MG capsule Take 300 mg by mouth 3 (three) times daily.   ipratropium-albuterol (DUONEB) 0.5-2.5 (3) MG/3ML SOLN Take 3 mLs by nebulization every 6 (six) hours as needed (shortness of breath).   LUMIGAN 0.01 % SOLN Place 1 drop into the left eye at bedtime.   metoprolol tartrate (LOPRESSOR) 25 MG tablet Take 12.5 mg by mouth 2 (two) times daily.   Multiple Vitamin (MULTIVITAMIN) tablet Take 1 tablet by mouth daily.   Olopatadine HCl (PATADAY) 0.2 % SOLN Place 1 drop into both eyes daily as needed (allergy symptoms).   omega-3 fish oil (MAXEPA) 1000 MG CAPS capsule Take 1 capsule by mouth 2 (two) times daily.   omeprazole (PRILOSEC) 40 MG capsule Take 40 mg by mouth daily.   SENNA PLUS 8.6-50 MG tablet Take 1 tablet by mouth 2 (two) times daily.   timolol (TIMOPTIC) 0.5 % ophthalmic solution Place 1 drop into the left eye 2 (two) times daily.     Allergies:   Patient has no known allergies.   Social History   Socioeconomic History   Marital status: Married    Spouse name: Not on file   Number of children: Not on file   Years of education: Not on file   Highest education level: Not on file  Occupational History   Not on file  Tobacco Use   Smoking status: Former    Current packs/day: 2.00    Average packs/day: 2.0 packs/day for 60.0 years (120.0 ttl pk-yrs)    Types: Cigarettes, Cigars   Smokeless tobacco: Never   Tobacco comments:    smokes 3 cigarettes/day 06/08/2018  Vaping Use   Vaping status: Never Used  Substance and Sexual Activity   Alcohol use: Not Currently   Drug use: Never   Sexual activity: Not on file  Other Topics Concern   Not on file  Social History Narrative   Not on file    Social Determinants of Health   Financial Resource Strain: Low Risk  (07/19/2023)   Overall Financial Resource Strain (CARDIA)    Difficulty of Paying Living Expenses: Not very hard  Food Insecurity: Food Insecurity Present (07/19/2023)   Hunger Vital Sign    Worried About Running Out of Food in the Last Year: Sometimes true    Ran Out of Food in the Last Year: Never true  Transportation Needs: No Transportation Needs (07/19/2023)   PRAPARE - Transportation  Lack of Transportation (Medical): No    Lack of Transportation (Non-Medical): No  Physical Activity: Insufficiently Active (07/19/2023)   Exercise Vital Sign    Days of Exercise per Week: 1 day    Minutes of Exercise per Session: 10 min  Stress: Not on file  Social Connections: Moderately Integrated (07/19/2023)   Social Connection and Isolation Panel [NHANES]    Frequency of Communication with Friends and Family: More than three times a week    Frequency of Social Gatherings with Friends and Family: Once a week    Attends Religious Services: More than 4 times per year    Active Member of [employer] or Organizations: Yes    Attends Banker Meetings: More than 4 times per year    Marital Status: Widowed     Family History: The patient's family history is not on file. ROS:   Please see the history of present illness.    All 14 point review of systems negative except as described per history of present illness.  EKGs/Labs/Other Studies Reviewed:    The following studies were reviewed today:   EKG:       Recent Labs: No results found for requested labs within last 365 days.  Recent Lipid Panel No results found for: "CHOL", "TRIG", "HDL", "CHOLHDL", "VLDL", "LDLCALC", "LDLDIRECT"  Physical Exam:    VS:  BP 131/79 (BP Location: Left Arm, Patient Position: Sitting, Cuff Size: Normal)   Pulse (!) 110   Ht 6' (1.829 m)   Wt 176 lb (79.8 kg)   SpO2 93%   BMI 23.87 kg/m     Wt Readings from Last 3  Encounters:  08/02/23 176 lb (79.8 kg)  07/06/23 179 lb 3.2 oz (81.3 kg)  07/31/21 161 lb 7 oz (73.2 kg)     GENERAL:  Well nourished, well developed in no acute distress NECK: No JVD; No carotid bruits CARDIAC: RRR, S1 and S2 present, no murmurs, no rubs, no gallops CHEST:  Clear to auscultation without rales, wheezing or rhonchi  Extremities: 1+ bilateral pitting ankle edema. Pulses bilaterally symmetric with radial 2+ and dorsalis pedis 2+ NEUROLOGIC:  Alert and oriented x 3  Medication Adjustments/Labs and Tests Ordered: Current medicines are reviewed at length with the patient today.  Concerns regarding medicines are outlined above.  No orders of the defined types were placed in this encounter.  No orders of the defined types were placed in this encounter.   Signed, Cecille Amsterdam, MD, MPH, [facility]. 08/02/2023 11:44 AM    [facility]

## 2023-08-02 NOTE — Patient Instructions (Signed)
Medication Instructions:   Your physician recommends that you continue on your current medications as directed. Please refer to the Current Medication list given to you today.  *If you need a refill on your cardiac medications before your next appointment, please call your pharmacy*   Lab Work: None ordered   If you have labs (blood work) drawn today and your tests are completely normal, you will receive your results only by: MyChart Message (if you have MyChart) OR A paper copy in the mail If you have any lab test that is abnormal or we need to change your treatment, we will call you to review the results.   Testing/Procedures:    Follow-Up: At [facility], you and your health needs are our priority.  As part of our continuing mission to provide you with exceptional heart care, we have created designated Provider Care Teams.  These Care Teams include your primary Cardiologist (physician) and Advanced Practice Providers (APPs -  Physician Assistants and Nurse Practitioners) who all work together to provide you with the care you need, when you need it.  We recommend signing up for the patient portal called "MyChart".  Sign up information is provided on this After Visit Summary.  MyChart is used to connect with patients for Virtual Visits (Telemedicine).  Patients are able to view lab/test results, encounter notes, upcoming appointments, etc.  Non-urgent messages can be sent to your provider as well.   To learn more about what you can do with MyChart, go to [email or web address].    Your next appointment:   3 month(s)  Provider:   Huntley Dec, MD

## 2023-08-02 NOTE — Assessment & Plan Note (Signed)
Follows up with Dr.Chodri for pulmonology. Reports he has been told about continuing on prednisone, however currently is not on steroids. I anticipate he was given steroids for acute COPD exacerbation.  Mentions he was wheezing acutely yesterday at the time of his ER visit and resolved with multiple treatments with albuterol inhalers in the ER.  Advised to reach out to his pulmonologist for further follow-up.

## 2023-08-02 NOTE — Assessment & Plan Note (Signed)
Well-controlled. Continue current medications metoprolol tartrate 12.5 mg twice daily. Target below 130 over 80 mmHg.

## 2023-08-02 NOTE — Assessment & Plan Note (Signed)
,   No pain or discomfort. Appears dependent edema, in the setting of diastolic dysfunction, poor dietary habits with excess salt consumption.  Would recommend low-salt diet discussed various dietary options and ways to reduce salt intake. He is willing to consider preparing meals at home and avoiding salty foods.  Continue with current Lasix dose furosemide 20 mg once daily.

## 2023-08-02 NOTE — Assessment & Plan Note (Signed)
Stable symptoms. No acute worsening. Functional status limited due to comorbidities, appears stable at CCS class II. Continue with dual antiplatelet therapy aspirin 81 mg once daily. Continue Plavix 75 mg once daily tentatively until March 2025.  Continue with atorvastatin 80 mg once daily. Currently on metoprolol tartrate 12.5 mg twice daily.

## 2023-08-03 DIAGNOSIS — J449 Chronic obstructive pulmonary disease, unspecified: Secondary | ICD-10-CM | POA: Diagnosis not present

## 2023-08-03 DIAGNOSIS — Z955 Presence of coronary angioplasty implant and graft: Secondary | ICD-10-CM | POA: Diagnosis not present

## 2023-08-03 DIAGNOSIS — I10 Essential (primary) hypertension: Secondary | ICD-10-CM | POA: Diagnosis not present

## 2023-08-03 DIAGNOSIS — I252 Old myocardial infarction: Secondary | ICD-10-CM | POA: Diagnosis not present

## 2023-08-07 DIAGNOSIS — J9610 Chronic respiratory failure, unspecified whether with hypoxia or hypercapnia: Secondary | ICD-10-CM | POA: Diagnosis not present

## 2023-08-07 DIAGNOSIS — J441 Chronic obstructive pulmonary disease with (acute) exacerbation: Secondary | ICD-10-CM | POA: Diagnosis not present

## 2023-08-08 ENCOUNTER — Encounter: Payer: Self-pay | Admitting: *Deleted

## 2023-08-08 ENCOUNTER — Telehealth: Payer: Self-pay | Admitting: *Deleted

## 2023-08-08 DIAGNOSIS — I5030 Unspecified diastolic (congestive) heart failure: Secondary | ICD-10-CM | POA: Diagnosis not present

## 2023-08-08 DIAGNOSIS — Z6824 Body mass index (BMI) 24.0-24.9, adult: Secondary | ICD-10-CM | POA: Diagnosis not present

## 2023-08-08 DIAGNOSIS — M545 Low back pain, unspecified: Secondary | ICD-10-CM | POA: Diagnosis not present

## 2023-08-08 DIAGNOSIS — Z955 Presence of coronary angioplasty implant and graft: Secondary | ICD-10-CM | POA: Diagnosis not present

## 2023-08-08 NOTE — Patient Outreach (Signed)
  Care Coordination   08/08/2023 Name: Daniel Tran MRN: [id number] DOB: Mar 02, 1946   Care Coordination Outreach Attempts:  An unsuccessful telephone outreach was attempted today to offer the patient information about available care coordination services.  Follow Up Plan:  Additional outreach attempts will be made to offer the patient care coordination information and services.   Encounter Outcome:  No Answer   Care Coordination Interventions:  No, not indicated    Reece Levy, MSW, LCSW [facility]  [facility], [facility] Licensed Clinical Social Worker Care Coordinator  [phone]

## 2023-08-09 ENCOUNTER — Encounter: Payer: Self-pay | Admitting: Nurse Practitioner

## 2023-08-10 ENCOUNTER — Telehealth: Payer: Self-pay | Admitting: *Deleted

## 2023-08-10 DIAGNOSIS — Z955 Presence of coronary angioplasty implant and graft: Secondary | ICD-10-CM | POA: Diagnosis not present

## 2023-08-10 NOTE — Patient Outreach (Signed)
  Care Coordination   08/10/2023 Name: Daniel Tran MRN: [id number] DOB: [birth year]   Care Coordination Outreach Attempts:  A second unsuccessful outreach was attempted today to offer the patient with information about available care coordination services.  Follow Up Plan:  Additional outreach attempts will be made to offer the patient care coordination information and services.   Encounter Outcome:  No Answer   Care Coordination Interventions:  No, not indicated   Reece Levy, MSW, LCSW [facility], [facility] Licensed Clinical Social Worker Care Coordinator  [phone]

## 2023-08-12 DIAGNOSIS — E7800 Pure hypercholesterolemia, unspecified: Secondary | ICD-10-CM | POA: Diagnosis not present

## 2023-08-12 DIAGNOSIS — R9431 Abnormal electrocardiogram [ECG] [EKG]: Secondary | ICD-10-CM | POA: Diagnosis not present

## 2023-08-12 DIAGNOSIS — I444 Left anterior fascicular block: Secondary | ICD-10-CM | POA: Diagnosis not present

## 2023-08-12 DIAGNOSIS — R600 Localized edema: Secondary | ICD-10-CM | POA: Diagnosis not present

## 2023-08-12 DIAGNOSIS — I517 Cardiomegaly: Secondary | ICD-10-CM | POA: Diagnosis not present

## 2023-08-12 DIAGNOSIS — M7989 Other specified soft tissue disorders: Secondary | ICD-10-CM | POA: Diagnosis not present

## 2023-08-12 DIAGNOSIS — Z6825 Body mass index (BMI) 25.0-25.9, adult: Secondary | ICD-10-CM | POA: Diagnosis not present

## 2023-08-12 DIAGNOSIS — I10 Essential (primary) hypertension: Secondary | ICD-10-CM | POA: Diagnosis not present

## 2023-08-12 DIAGNOSIS — J449 Chronic obstructive pulmonary disease, unspecified: Secondary | ICD-10-CM | POA: Diagnosis not present

## 2023-08-12 DIAGNOSIS — I509 Heart failure, unspecified: Secondary | ICD-10-CM | POA: Diagnosis not present

## 2023-08-12 DIAGNOSIS — I110 Hypertensive heart disease with heart failure: Secondary | ICD-10-CM | POA: Diagnosis not present

## 2023-08-12 DIAGNOSIS — R0602 Shortness of breath: Secondary | ICD-10-CM | POA: Diagnosis not present

## 2023-08-13 DIAGNOSIS — M7989 Other specified soft tissue disorders: Secondary | ICD-10-CM | POA: Diagnosis not present

## 2023-08-15 ENCOUNTER — Telehealth: Payer: Self-pay | Admitting: *Deleted

## 2023-08-15 DIAGNOSIS — M7989 Other specified soft tissue disorders: Secondary | ICD-10-CM | POA: Diagnosis not present

## 2023-08-15 DIAGNOSIS — I517 Cardiomegaly: Secondary | ICD-10-CM | POA: Diagnosis not present

## 2023-08-15 DIAGNOSIS — R609 Edema, unspecified: Secondary | ICD-10-CM | POA: Diagnosis not present

## 2023-08-15 DIAGNOSIS — D649 Anemia, unspecified: Secondary | ICD-10-CM | POA: Diagnosis not present

## 2023-08-15 DIAGNOSIS — R918 Other nonspecific abnormal finding of lung field: Secondary | ICD-10-CM | POA: Diagnosis not present

## 2023-08-15 DIAGNOSIS — E875 Hyperkalemia: Secondary | ICD-10-CM | POA: Diagnosis not present

## 2023-08-15 DIAGNOSIS — J929 Pleural plaque without asbestos: Secondary | ICD-10-CM | POA: Diagnosis not present

## 2023-08-15 DIAGNOSIS — R059 Cough, unspecified: Secondary | ICD-10-CM | POA: Diagnosis not present

## 2023-08-15 DIAGNOSIS — Z743 Need for continuous supervision: Secondary | ICD-10-CM | POA: Diagnosis not present

## 2023-08-15 DIAGNOSIS — I4510 Unspecified right bundle-branch block: Secondary | ICD-10-CM | POA: Diagnosis not present

## 2023-08-15 DIAGNOSIS — R0602 Shortness of breath: Secondary | ICD-10-CM | POA: Diagnosis not present

## 2023-08-15 DIAGNOSIS — J441 Chronic obstructive pulmonary disease with (acute) exacerbation: Secondary | ICD-10-CM | POA: Diagnosis not present

## 2023-08-15 DIAGNOSIS — R0689 Other abnormalities of breathing: Secondary | ICD-10-CM | POA: Diagnosis not present

## 2023-08-15 NOTE — Patient Outreach (Signed)
  Care Coordination   08/15/2023 Name: Daniel Tran MRN: [id number] DOB: [birth year]   Care Coordination Outreach Attempts:  A third unsuccessful outreach was attempted today to offer the patient with information about available care coordination services.  Follow Up Plan:  {UNSUCCESSFULFU:27885}  Encounter Outcome:  {ENCOUTCOME:27770} {THN Tip this will not be part of the note when signed-REQUIRED REPORT FIELD DO NOT DELETE (Optional):27901}  Care Coordination Interventions:  {INTERVENTIONS:27767}  {THN Tip this will not be part of the note when signed-REQUIRED REPORT FIELD DO NOT DELETE (Optional):27901}  SIG ***

## 2023-08-17 DIAGNOSIS — I5030 Unspecified diastolic (congestive) heart failure: Secondary | ICD-10-CM | POA: Diagnosis not present

## 2023-08-17 DIAGNOSIS — I10 Essential (primary) hypertension: Secondary | ICD-10-CM | POA: Diagnosis not present

## 2023-08-17 DIAGNOSIS — Z955 Presence of coronary angioplasty implant and graft: Secondary | ICD-10-CM | POA: Diagnosis not present

## 2023-08-18 DIAGNOSIS — Z6825 Body mass index (BMI) 25.0-25.9, adult: Secondary | ICD-10-CM | POA: Diagnosis not present

## 2023-08-18 DIAGNOSIS — I5030 Unspecified diastolic (congestive) heart failure: Secondary | ICD-10-CM | POA: Diagnosis not present

## 2023-08-18 DIAGNOSIS — M79641 Pain in right hand: Secondary | ICD-10-CM | POA: Diagnosis not present

## 2023-08-19 DIAGNOSIS — Z955 Presence of coronary angioplasty implant and graft: Secondary | ICD-10-CM | POA: Diagnosis not present

## 2023-08-22 DIAGNOSIS — Z955 Presence of coronary angioplasty implant and graft: Secondary | ICD-10-CM | POA: Diagnosis not present

## 2023-08-22 DIAGNOSIS — I10 Essential (primary) hypertension: Secondary | ICD-10-CM | POA: Diagnosis not present

## 2023-08-22 DIAGNOSIS — Z6824 Body mass index (BMI) 24.0-24.9, adult: Secondary | ICD-10-CM | POA: Diagnosis not present

## 2023-08-22 DIAGNOSIS — I5030 Unspecified diastolic (congestive) heart failure: Secondary | ICD-10-CM | POA: Diagnosis not present

## 2023-08-24 DIAGNOSIS — Z955 Presence of coronary angioplasty implant and graft: Secondary | ICD-10-CM | POA: Diagnosis not present

## 2023-08-25 DIAGNOSIS — Z6825 Body mass index (BMI) 25.0-25.9, adult: Secondary | ICD-10-CM | POA: Diagnosis not present

## 2023-08-25 DIAGNOSIS — I10 Essential (primary) hypertension: Secondary | ICD-10-CM | POA: Diagnosis not present

## 2023-08-26 DIAGNOSIS — Z955 Presence of coronary angioplasty implant and graft: Secondary | ICD-10-CM | POA: Diagnosis not present

## 2023-08-26 DIAGNOSIS — I10 Essential (primary) hypertension: Secondary | ICD-10-CM | POA: Diagnosis not present

## 2023-08-29 DIAGNOSIS — Z6825 Body mass index (BMI) 25.0-25.9, adult: Secondary | ICD-10-CM | POA: Diagnosis not present

## 2023-08-29 DIAGNOSIS — I5030 Unspecified diastolic (congestive) heart failure: Secondary | ICD-10-CM | POA: Diagnosis not present

## 2023-08-29 DIAGNOSIS — M7989 Other specified soft tissue disorders: Secondary | ICD-10-CM | POA: Diagnosis not present

## 2023-09-01 DIAGNOSIS — M1712 Unilateral primary osteoarthritis, left knee: Secondary | ICD-10-CM | POA: Diagnosis not present

## 2023-09-01 DIAGNOSIS — M79605 Pain in left leg: Secondary | ICD-10-CM | POA: Diagnosis not present

## 2023-09-01 DIAGNOSIS — Z955 Presence of coronary angioplasty implant and graft: Secondary | ICD-10-CM | POA: Diagnosis not present

## 2023-09-02 DIAGNOSIS — E538 Deficiency of other specified B group vitamins: Secondary | ICD-10-CM | POA: Diagnosis not present

## 2023-09-02 DIAGNOSIS — Z6824 Body mass index (BMI) 24.0-24.9, adult: Secondary | ICD-10-CM | POA: Diagnosis not present

## 2023-09-02 DIAGNOSIS — Z955 Presence of coronary angioplasty implant and graft: Secondary | ICD-10-CM | POA: Diagnosis not present

## 2023-09-02 DIAGNOSIS — G5602 Carpal tunnel syndrome, left upper limb: Secondary | ICD-10-CM | POA: Diagnosis not present

## 2023-09-05 DIAGNOSIS — G5601 Carpal tunnel syndrome, right upper limb: Secondary | ICD-10-CM | POA: Diagnosis not present

## 2023-09-05 DIAGNOSIS — J449 Chronic obstructive pulmonary disease, unspecified: Secondary | ICD-10-CM | POA: Diagnosis not present

## 2023-09-05 DIAGNOSIS — Z6824 Body mass index (BMI) 24.0-24.9, adult: Secondary | ICD-10-CM | POA: Diagnosis not present

## 2023-09-05 DIAGNOSIS — I25119 Atherosclerotic heart disease of native coronary artery with unspecified angina pectoris: Secondary | ICD-10-CM | POA: Diagnosis not present

## 2023-09-05 DIAGNOSIS — I10 Essential (primary) hypertension: Secondary | ICD-10-CM | POA: Diagnosis not present

## 2023-09-06 DIAGNOSIS — Z955 Presence of coronary angioplasty implant and graft: Secondary | ICD-10-CM | POA: Diagnosis not present

## 2023-09-07 DIAGNOSIS — J9610 Chronic respiratory failure, unspecified whether with hypoxia or hypercapnia: Secondary | ICD-10-CM | POA: Diagnosis not present

## 2023-09-07 DIAGNOSIS — J441 Chronic obstructive pulmonary disease with (acute) exacerbation: Secondary | ICD-10-CM | POA: Diagnosis not present

## 2023-09-09 DIAGNOSIS — Z955 Presence of coronary angioplasty implant and graft: Secondary | ICD-10-CM | POA: Diagnosis not present

## 2023-09-12 DIAGNOSIS — I5030 Unspecified diastolic (congestive) heart failure: Secondary | ICD-10-CM | POA: Diagnosis not present

## 2023-09-12 DIAGNOSIS — Z955 Presence of coronary angioplasty implant and graft: Secondary | ICD-10-CM | POA: Diagnosis not present

## 2023-09-12 DIAGNOSIS — H401123 Primary open-angle glaucoma, left eye, severe stage: Secondary | ICD-10-CM | POA: Diagnosis not present

## 2023-09-12 DIAGNOSIS — H524 Presbyopia: Secondary | ICD-10-CM | POA: Diagnosis not present

## 2023-09-14 DIAGNOSIS — Z955 Presence of coronary angioplasty implant and graft: Secondary | ICD-10-CM | POA: Diagnosis not present

## 2023-09-17 IMAGING — DX DG CHEST 2V
2 series · 2 of 2 positions shown · non-contrast
Comparison: 03/26/2021 and older studies.

CLINICAL DATA: Short of breath.  History of COPD.

EXAM:
CHEST - 2 VIEW

[chest pa]
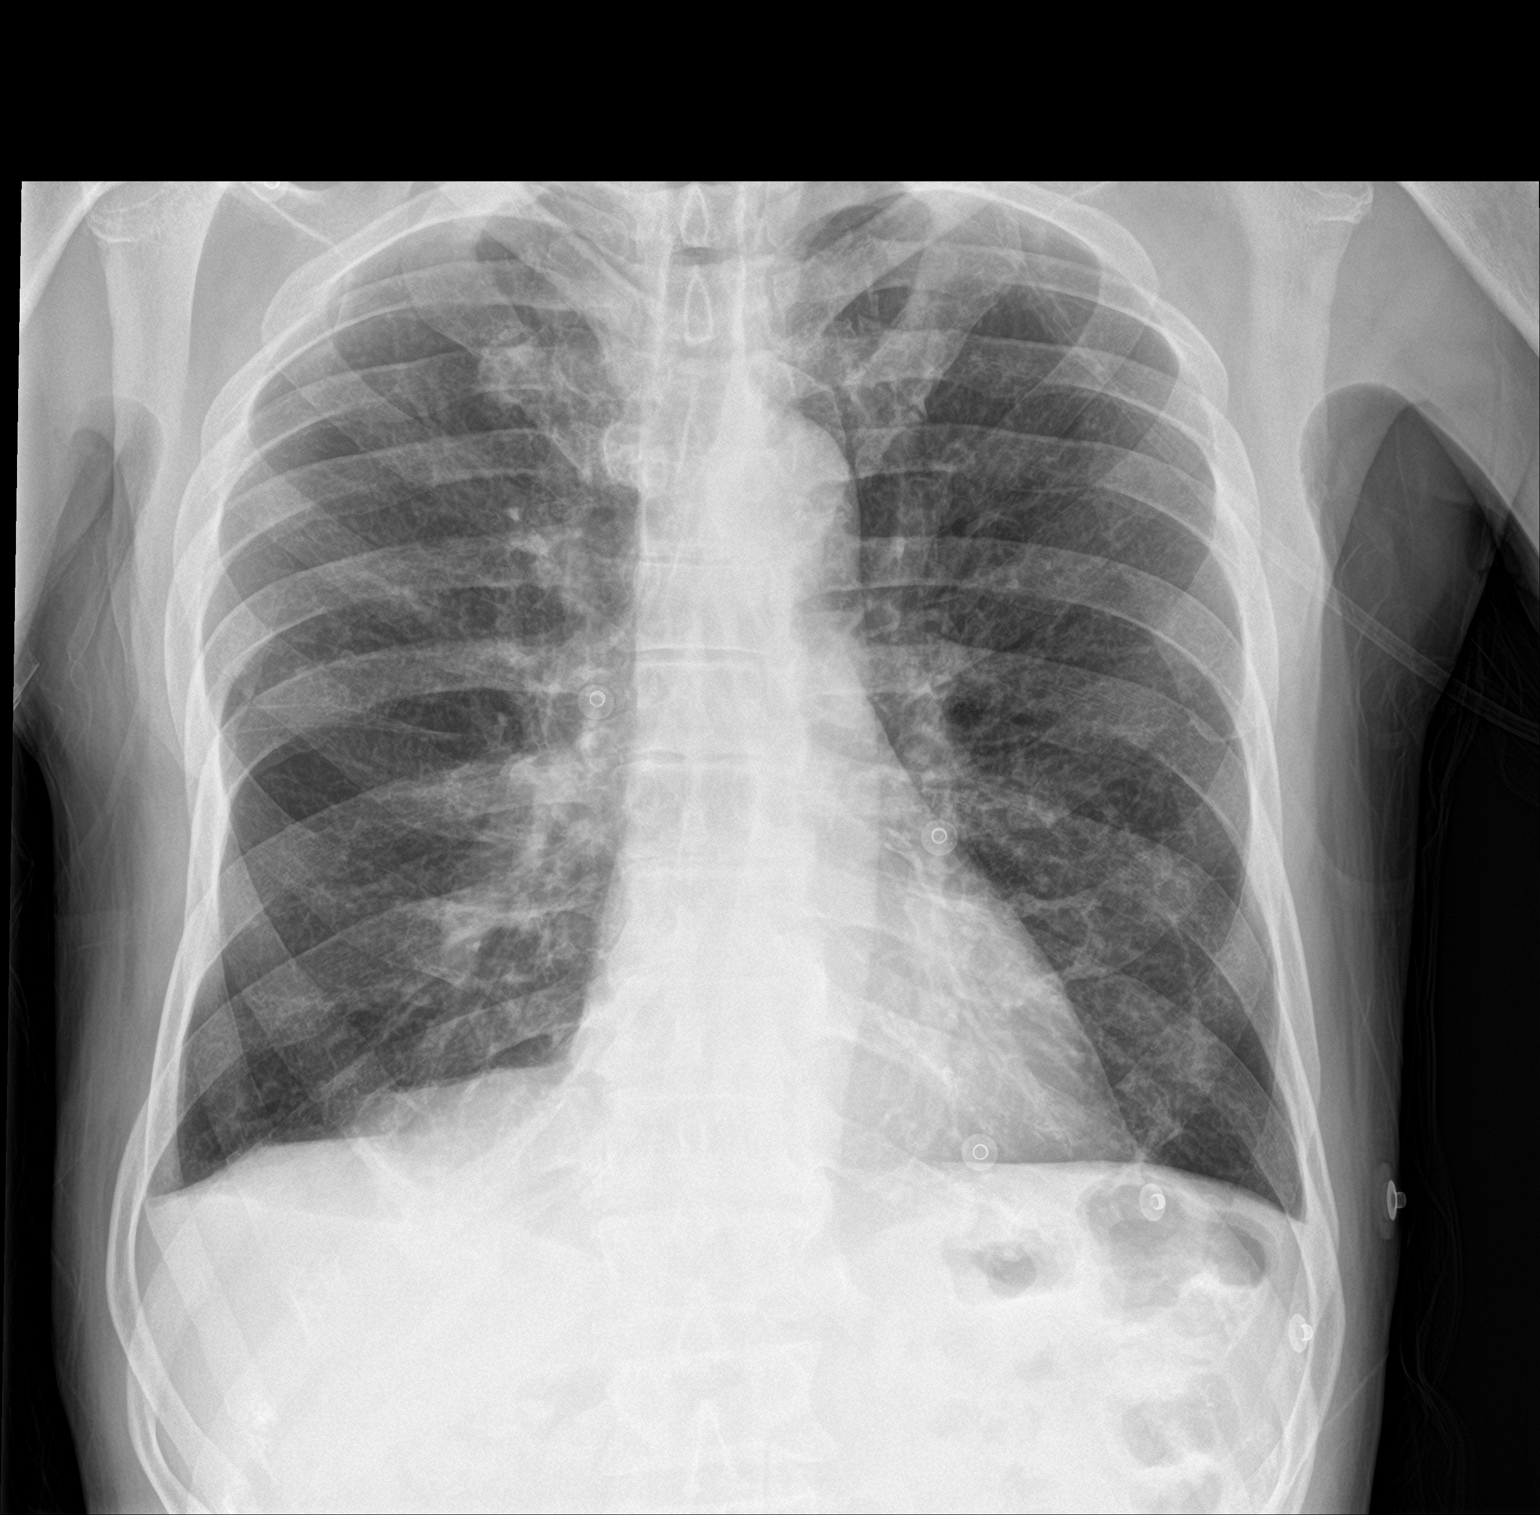

[chest lat]
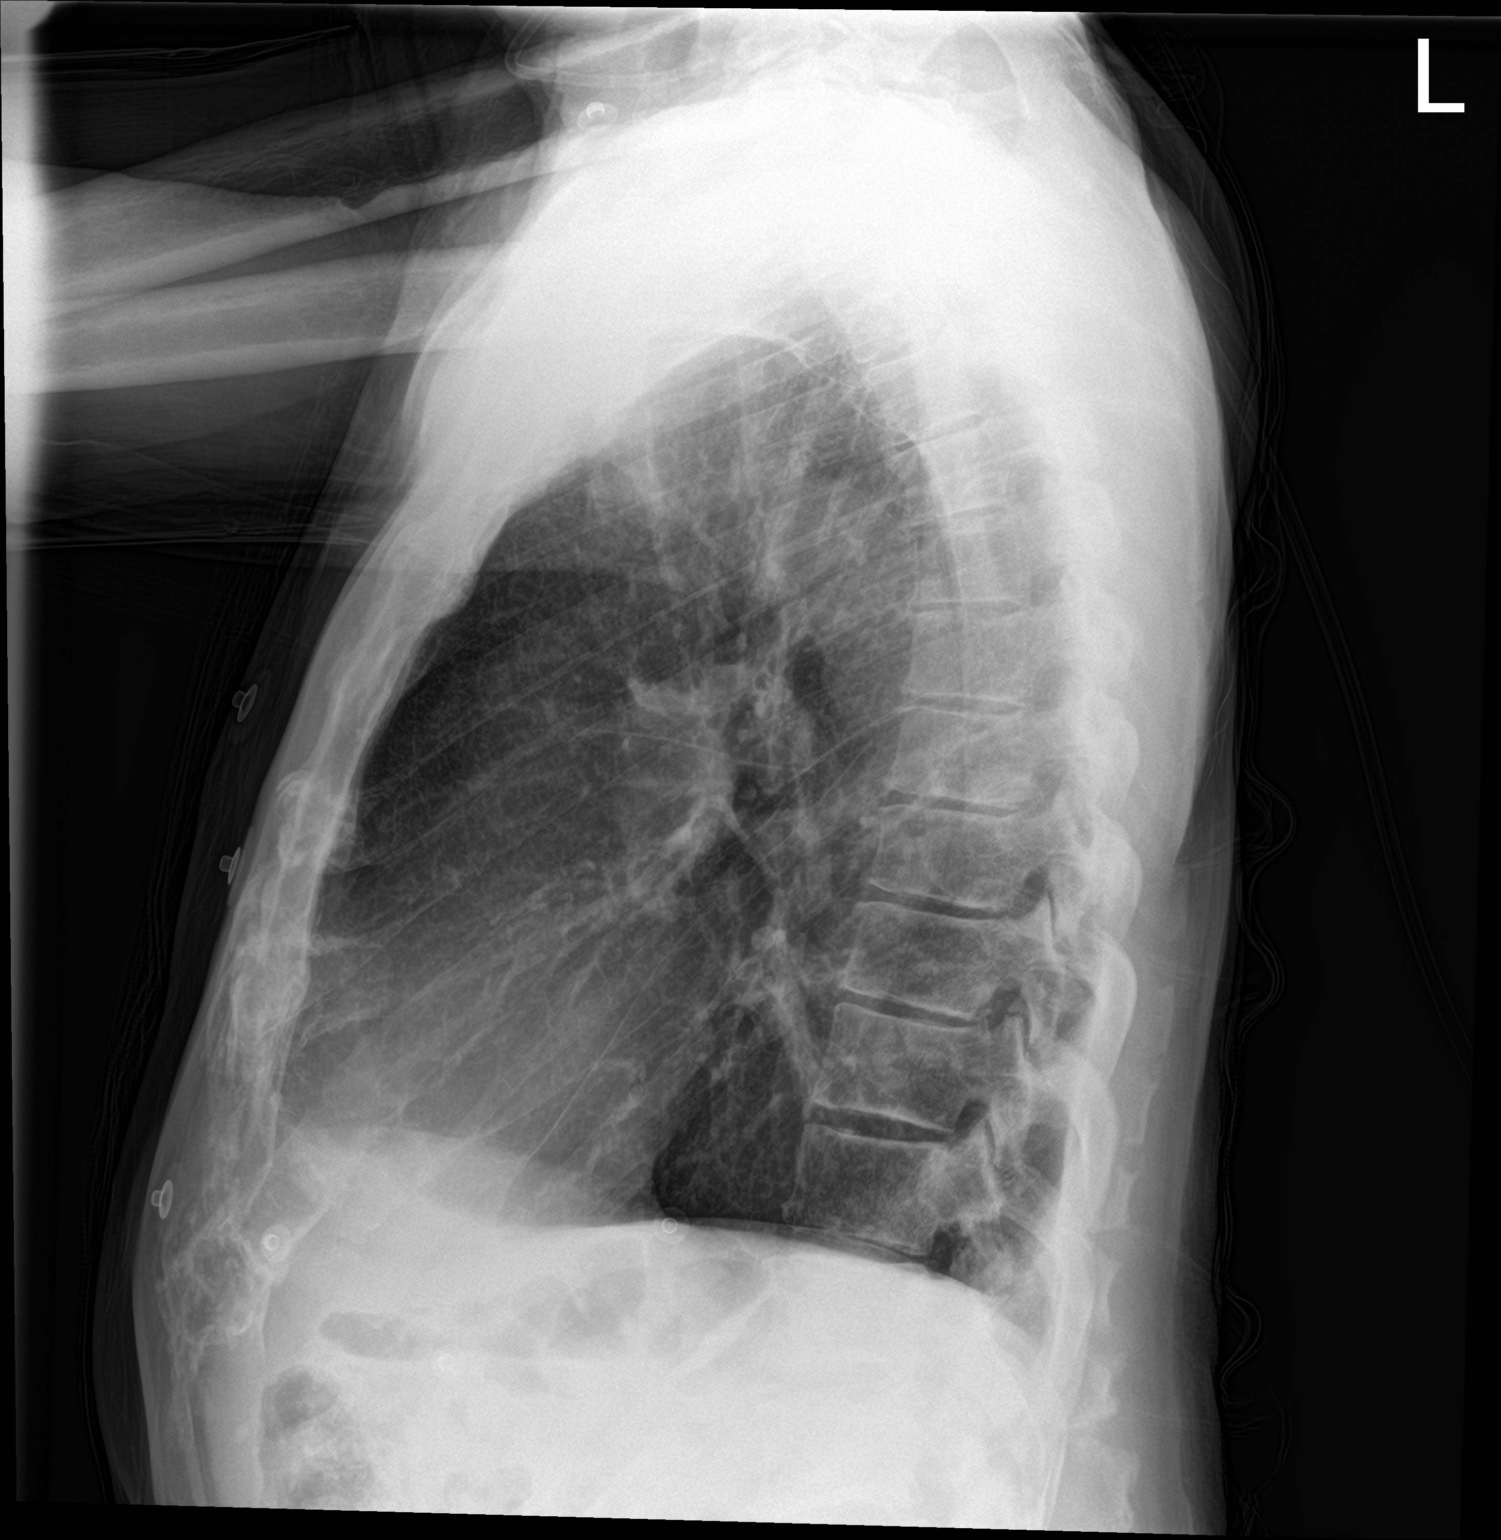

[2 of 2 positions shown; findings below may reference images not displayed]

FINDINGS: Cardiac silhouette is normal in size. Normal mediastinal and hilar
contours.

Lungs are hyperexpanded, but clear.

No pleural effusion or pneumothorax.

Skeletal structures are intact.
IMPRESSION: No active cardiopulmonary disease.

## 2023-09-19 DIAGNOSIS — S29011A Strain of muscle and tendon of front wall of thorax, initial encounter: Secondary | ICD-10-CM | POA: Diagnosis not present

## 2023-09-19 DIAGNOSIS — Z6823 Body mass index (BMI) 23.0-23.9, adult: Secondary | ICD-10-CM | POA: Diagnosis not present

## 2023-09-19 DIAGNOSIS — I700 Atherosclerosis of aorta: Secondary | ICD-10-CM | POA: Diagnosis not present

## 2023-09-19 DIAGNOSIS — I25119 Atherosclerotic heart disease of native coronary artery with unspecified angina pectoris: Secondary | ICD-10-CM | POA: Diagnosis not present

## 2023-09-21 DIAGNOSIS — Z955 Presence of coronary angioplasty implant and graft: Secondary | ICD-10-CM | POA: Diagnosis not present

## 2023-09-23 DIAGNOSIS — Z955 Presence of coronary angioplasty implant and graft: Secondary | ICD-10-CM | POA: Diagnosis not present

## 2023-09-23 DIAGNOSIS — J449 Chronic obstructive pulmonary disease, unspecified: Secondary | ICD-10-CM | POA: Diagnosis not present

## 2023-09-23 DIAGNOSIS — H401123 Primary open-angle glaucoma, left eye, severe stage: Secondary | ICD-10-CM | POA: Diagnosis not present

## 2023-09-30 DIAGNOSIS — Z955 Presence of coronary angioplasty implant and graft: Secondary | ICD-10-CM | POA: Diagnosis not present

## 2023-10-03 DIAGNOSIS — J441 Chronic obstructive pulmonary disease with (acute) exacerbation: Secondary | ICD-10-CM | POA: Diagnosis not present

## 2023-10-03 DIAGNOSIS — Z955 Presence of coronary angioplasty implant and graft: Secondary | ICD-10-CM | POA: Diagnosis not present

## 2023-10-05 DIAGNOSIS — Z20822 Contact with and (suspected) exposure to covid-19: Secondary | ICD-10-CM | POA: Diagnosis not present

## 2023-10-05 DIAGNOSIS — Z955 Presence of coronary angioplasty implant and graft: Secondary | ICD-10-CM | POA: Diagnosis not present

## 2023-10-05 DIAGNOSIS — R059 Cough, unspecified: Secondary | ICD-10-CM | POA: Diagnosis not present

## 2023-10-05 DIAGNOSIS — Z6823 Body mass index (BMI) 23.0-23.9, adult: Secondary | ICD-10-CM | POA: Diagnosis not present

## 2023-10-05 DIAGNOSIS — J441 Chronic obstructive pulmonary disease with (acute) exacerbation: Secondary | ICD-10-CM | POA: Diagnosis not present

## 2023-10-07 DIAGNOSIS — Z955 Presence of coronary angioplasty implant and graft: Secondary | ICD-10-CM | POA: Diagnosis not present

## 2023-10-08 DIAGNOSIS — J441 Chronic obstructive pulmonary disease with (acute) exacerbation: Secondary | ICD-10-CM | POA: Diagnosis not present

## 2023-10-08 DIAGNOSIS — J9610 Chronic respiratory failure, unspecified whether with hypoxia or hypercapnia: Secondary | ICD-10-CM | POA: Diagnosis not present

## 2023-10-10 DIAGNOSIS — Z955 Presence of coronary angioplasty implant and graft: Secondary | ICD-10-CM | POA: Diagnosis not present

## 2023-10-14 DIAGNOSIS — Z6823 Body mass index (BMI) 23.0-23.9, adult: Secondary | ICD-10-CM | POA: Diagnosis not present

## 2023-10-14 DIAGNOSIS — Z955 Presence of coronary angioplasty implant and graft: Secondary | ICD-10-CM | POA: Diagnosis not present

## 2023-10-14 DIAGNOSIS — R059 Cough, unspecified: Secondary | ICD-10-CM | POA: Diagnosis not present

## 2023-10-14 DIAGNOSIS — Z20822 Contact with and (suspected) exposure to covid-19: Secondary | ICD-10-CM | POA: Diagnosis not present

## 2023-10-14 DIAGNOSIS — U071 COVID-19: Secondary | ICD-10-CM | POA: Diagnosis not present

## 2023-10-16 DIAGNOSIS — I517 Cardiomegaly: Secondary | ICD-10-CM | POA: Diagnosis not present

## 2023-10-16 DIAGNOSIS — Z743 Need for continuous supervision: Secondary | ICD-10-CM | POA: Diagnosis not present

## 2023-10-16 DIAGNOSIS — I10 Essential (primary) hypertension: Secondary | ICD-10-CM | POA: Diagnosis not present

## 2023-10-16 DIAGNOSIS — R9431 Abnormal electrocardiogram [ECG] [EKG]: Secondary | ICD-10-CM | POA: Diagnosis not present

## 2023-10-16 DIAGNOSIS — J441 Chronic obstructive pulmonary disease with (acute) exacerbation: Secondary | ICD-10-CM | POA: Diagnosis not present

## 2023-10-16 DIAGNOSIS — A419 Sepsis, unspecified organism: Secondary | ICD-10-CM | POA: Diagnosis not present

## 2023-10-16 DIAGNOSIS — J208 Acute bronchitis due to other specified organisms: Secondary | ICD-10-CM | POA: Diagnosis not present

## 2023-10-16 DIAGNOSIS — R059 Cough, unspecified: Secondary | ICD-10-CM | POA: Diagnosis not present

## 2023-10-16 DIAGNOSIS — U071 COVID-19: Secondary | ICD-10-CM | POA: Diagnosis not present

## 2023-10-16 DIAGNOSIS — R6889 Other general symptoms and signs: Secondary | ICD-10-CM | POA: Diagnosis not present

## 2023-10-16 DIAGNOSIS — J440 Chronic obstructive pulmonary disease with acute lower respiratory infection: Secondary | ICD-10-CM | POA: Diagnosis not present

## 2023-10-16 DIAGNOSIS — I4510 Unspecified right bundle-branch block: Secondary | ICD-10-CM | POA: Diagnosis not present

## 2023-10-17 DIAGNOSIS — I4510 Unspecified right bundle-branch block: Secondary | ICD-10-CM | POA: Diagnosis not present

## 2023-10-17 DIAGNOSIS — I517 Cardiomegaly: Secondary | ICD-10-CM | POA: Diagnosis not present

## 2023-10-17 DIAGNOSIS — J441 Chronic obstructive pulmonary disease with (acute) exacerbation: Secondary | ICD-10-CM | POA: Diagnosis not present

## 2023-10-17 DIAGNOSIS — A419 Sepsis, unspecified organism: Secondary | ICD-10-CM | POA: Diagnosis not present

## 2023-10-17 DIAGNOSIS — U071 COVID-19: Secondary | ICD-10-CM | POA: Diagnosis not present

## 2023-10-17 DIAGNOSIS — R9431 Abnormal electrocardiogram [ECG] [EKG]: Secondary | ICD-10-CM | POA: Diagnosis not present

## 2023-10-17 DIAGNOSIS — I10 Essential (primary) hypertension: Secondary | ICD-10-CM | POA: Diagnosis not present

## 2023-10-20 DIAGNOSIS — J441 Chronic obstructive pulmonary disease with (acute) exacerbation: Secondary | ICD-10-CM | POA: Diagnosis not present

## 2023-10-21 DIAGNOSIS — Z955 Presence of coronary angioplasty implant and graft: Secondary | ICD-10-CM | POA: Diagnosis not present

## 2023-10-24 DIAGNOSIS — J449 Chronic obstructive pulmonary disease, unspecified: Secondary | ICD-10-CM | POA: Diagnosis not present

## 2023-10-24 DIAGNOSIS — Z955 Presence of coronary angioplasty implant and graft: Secondary | ICD-10-CM | POA: Diagnosis not present

## 2023-10-28 DIAGNOSIS — Z955 Presence of coronary angioplasty implant and graft: Secondary | ICD-10-CM | POA: Diagnosis not present

## 2023-10-31 DIAGNOSIS — Z6822 Body mass index (BMI) 22.0-22.9, adult: Secondary | ICD-10-CM | POA: Diagnosis not present

## 2023-10-31 DIAGNOSIS — J441 Chronic obstructive pulmonary disease with (acute) exacerbation: Secondary | ICD-10-CM | POA: Diagnosis not present

## 2023-10-31 DIAGNOSIS — Z955 Presence of coronary angioplasty implant and graft: Secondary | ICD-10-CM | POA: Diagnosis not present

## 2023-10-31 DIAGNOSIS — J9610 Chronic respiratory failure, unspecified whether with hypoxia or hypercapnia: Secondary | ICD-10-CM | POA: Diagnosis not present

## 2023-10-31 DIAGNOSIS — I5030 Unspecified diastolic (congestive) heart failure: Secondary | ICD-10-CM | POA: Diagnosis not present

## 2023-10-31 DIAGNOSIS — R109 Unspecified abdominal pain: Secondary | ICD-10-CM | POA: Diagnosis not present

## 2023-11-02 DIAGNOSIS — Z955 Presence of coronary angioplasty implant and graft: Secondary | ICD-10-CM | POA: Diagnosis not present

## 2023-11-04 DIAGNOSIS — Z955 Presence of coronary angioplasty implant and graft: Secondary | ICD-10-CM | POA: Diagnosis not present

## 2023-11-05 DIAGNOSIS — J441 Chronic obstructive pulmonary disease with (acute) exacerbation: Secondary | ICD-10-CM | POA: Diagnosis not present

## 2023-11-05 DIAGNOSIS — J9610 Chronic respiratory failure, unspecified whether with hypoxia or hypercapnia: Secondary | ICD-10-CM | POA: Diagnosis not present

## 2023-11-07 ENCOUNTER — Ambulatory Visit: Payer: Medicare Other

## 2023-11-07 VITALS — BP 100/40 | HR 85 | Ht 72.0 in | Wt 164.2 lb

## 2023-11-07 DIAGNOSIS — I10 Essential (primary) hypertension: Secondary | ICD-10-CM | POA: Diagnosis not present

## 2023-11-07 DIAGNOSIS — Z955 Presence of coronary angioplasty implant and graft: Secondary | ICD-10-CM | POA: Diagnosis not present

## 2023-11-07 DIAGNOSIS — Z720 Tobacco use: Secondary | ICD-10-CM

## 2023-11-07 DIAGNOSIS — J432 Centrilobular emphysema: Secondary | ICD-10-CM | POA: Diagnosis not present

## 2023-11-07 DIAGNOSIS — I25118 Atherosclerotic heart disease of native coronary artery with other forms of angina pectoris: Secondary | ICD-10-CM | POA: Diagnosis not present

## 2023-11-07 NOTE — Assessment & Plan Note (Signed)
 Advised to quit smoking

## 2023-11-07 NOTE — Progress Notes (Signed)
 Cardiology Consultation:    Date:  11/07/2023   ID:  Daniel Tran, DOB Mar 04, 1946, MRN [id number]  PCP:  Daniel Greenspan, MD  Cardiologist:  Daniel Corporal Brigido Mera, MD   Referring MD: Daniel Greenspan, MD   No chief complaint on file.    ASSESSMENT AND PLAN:   Daniel Tran 78 year old male patient with history of CAD s/p NSTEMI September 2024 At [facility] showing subtotal occlusion of ostial RCA with distal left-to-right collaterals, moderate LAD and LCx disease unsuccessful PCI attempts of RCA [next day high risk PCI with orbital atherectomy versus medical therapy discussed and patient preferred medical therapy], hypertension, mild TR, mild aortic insufficiency, smoking, COPD on 4 L/min nocturnal O2, mild dementia, blind in right eye, poor overall insight into his health condition, medications are picked up prepackaged on a weekly basis from PCPs office.  Problem List Items Addressed This Visit     COPD (chronic obstructive pulmonary disease) (HCC)   History of COPD, follows up with pulmonologist. Significant wheezing bilaterally today. Advised to continue with his albuterol rescue inhaler and nebulizer therapies as needed.       Tobacco abuse   Advised to quit smoking.      CAD, S/p NSTEMI September 2024 05/23/2023 at [facility], subtotal occlusion of RCA, unsuccessful PCI - Primary   Stable. Doing well with cardiac rehab. Continue with aspirin 81 mg once daily indefinitely. Continue with Plavix 75 mg once daily and recent of March, continue indefinitely if he does not have any significant symptoms or side effects.  Continue with atorvastatin 80 mg once daily Continue metoprolol tartrate 12.5 mg twice daily.       Relevant Orders   EKG 12-Lead (Completed)   Hypertension, benign   Well-controlled. Target below 130 over 80 mmHg. Continue metoprolol tartrate 12.5 mg twice daily. In fact he tends to run low with his blood pressure send advised him to hydrate himself well.          Return to clinic for  History of Present Illness:    Daniel Tran is a 78 y.o. male who is being seen today for follow-up visit. PCP is Daniel Greenspan, MD. Last visit with me in the office was 08/02/2023.  History of CAD s/p NSTEMI September 2024 at [facility] with cath showing subtotal occlusion of ostial RCA and distal left to right collaterals with moderate LAD and LCx disease unsuccessful PCI attempt of RCA [next day high risk complex PCI with orbital atherectomy discussed which patient declined and continued on medical therapy], hypertension, mild TR, mild aortic insufficiency, smoking, COPD uses 4 L/min nocturnal O2, mild dementia, blind in right eye, poor overall insight into his health condition. Lived by himself and takes medications that come from PCPs office in a prepackaged packets. Diet includes food from local restaurants.  Last hospital visit was back 08/15/2023 for shortness of breath, was hemodynamically stable, no significant acute findings on chest x-ray, discharged home after placing nebulizer therapy.  At home lives by himself but recently his and his daughter has been staying with him for few days. Mentions he feels at his baseline.  Participating in cardiac rehab regularly.  Papers from cardiac rehab show blood pressures and heart rate responses appropriate.  There was 1 instance of low blood pressure a few days ago.  Mentions overall he feels well.  Uses albuterol rescue inhaler as needed and nebulizer therapies 2 times a day.  Mentions he can walk across a parking lot and denies chest pain but  does get out of breath. He does describe the sense of knots in his chest which occurs randomly.  No other cardiac symptoms.  Denies any chest pain  Continues to smoke up to 2 cigarettes a day. Good compliance with his medications as prescribed and precath packaged from PCPs office.  However he does not remember the names. Will request an updated recent office visit from PCP  to update the medication list.  ED in the clinic today shows sinus rhythm heart rate 85/min, left axis deviation with QRS duration 94 ms, incomplete RBBB morphology.  No significant change in comparison to prior EKG available from 07/06/2023.  Echocardiogram last available to review from 07/14/2023 at [facility] mild concentric LVH with normal biventricular function, LVEF 55 to 60%, mild aortic insufficiency.  Was similar on prior echocardiogram September 2024 at [facility] with trace aortic insufficiency and EF 60 to 65%  Blood work from 08/15/2023 noted BNP normal 93 CMP with normal sodium 140, potassium 4.2. BUN 24, creatinine 1.2, EGFR 62 Mildly elevated AST 50 and ALT 61 Alkaline phosphatase normal CBC with hemoglobin 11.1 and hematocrit 32.7, normal WBC and platelet counts.   Past Medical History:  Diagnosis Date   Bilateral lower extremity edema 08/02/2023   CAD, S/p NSTEMI September 2024 05/23/2023 at [facility], subtotal occlusion of RCA, unsuccessful PCI 07/06/2023   COPD (chronic obstructive pulmonary disease) (HCC)    Elevated troponin 05/15/2023   Glaucoma    Hypertension, benign    Macrocytosis 05/21/2023   Mild aortic insufficiency, by TTE 07/14/2023 at [facility] 08/02/2023   NSTEMI (non-ST elevated myocardial infarction) (HCC) 05/22/2023   On home oxygen therapy 05/15/2023   Pulmonary nodule, left 12/21/2017   CT 2016 LLL 6mm   Respiratory failure (HCC) 05/20/2023   Seasonal allergic rhinitis    Thrush 05/19/2023   Tobacco abuse 12/21/2017    Past Surgical History:  Procedure Laterality Date   EYE SURGERY Bilateral 04/11/2019   Ectropion repair by lateral tarsal strip & qickert bilateral eyelids & punctoplasty bilateral lower puncta   EYE SURGERY Bilateral 04/11/2019   Snip incision of lacrimal punctum; Surgeon: Elmarie Shiley, MD; Location: [facility]   HERNIA REPAIR     inguinal    Current Medications: Current Meds  Medication  Sig   acetaminophen (TYLENOL) 650 MG CR tablet Take 650 mg by mouth every 8 (eight) hours as needed for pain.   albuterol (PROVENTIL) (2.5 MG/3ML) 0.083% nebulizer solution Take 2.5 mg by nebulization every 6 (six) hours as needed for wheezing or shortness of breath.   albuterol (VENTOLIN HFA) 108 (90 Base) MCG/ACT inhaler Inhale 1-2 puffs into the lungs every 4 (four) hours as needed for wheezing or shortness of breath.   aspirin 81 MG chewable tablet Chew 81 mg by mouth daily.   atorvastatin (LIPITOR) 80 MG tablet Take 80 mg by mouth daily.   B-12 MICROLOZENGE 500 MCG SUBL Take 2 tablets by mouth daily.   brinzolamide (AZOPT) 1 % ophthalmic suspension Place 1 drop into the left eye 3 (three) times daily.   clopidogrel (PLAVIX) 75 MG tablet Take 1 tablet (75 mg total) by mouth daily.   dorzolamide-timolol (COSOPT) 2-0.5 % ophthalmic solution Place 1 drop into the left eye 2 (two) times daily.   Fluticasone-Umeclidin-Vilant (TRELEGY ELLIPTA) 100-62.5-25 MCG/ACT AEPB Inhale 1 puff into the lungs daily.   furosemide (LASIX) 20 MG tablet Take 20 mg by mouth daily.   gabapentin (NEURONTIN) 300 MG capsule Take 300 mg  by mouth 3 (three) times daily.   ipratropium-albuterol (DUONEB) 0.5-2.5 (3) MG/3ML SOLN Take 3 mLs by nebulization every 6 (six) hours as needed (shortness of breath).   LUMIGAN 0.01 % SOLN Place 1 drop into the left eye at bedtime.   metoprolol tartrate (LOPRESSOR) 25 MG tablet Take 12.5 mg by mouth 2 (two) times daily.   Multiple Vitamin (MULTIVITAMIN) tablet Take 1 tablet by mouth daily.   Olopatadine HCl (PATADAY) 0.2 % SOLN Place 1 drop into both eyes daily as needed (allergy symptoms).   omega-3 fish oil (MAXEPA) 1000 MG CAPS capsule Take 1 capsule by mouth 2 (two) times daily.   omeprazole (PRILOSEC) 40 MG capsule Take 40 mg by mouth daily.   SENNA PLUS 8.6-50 MG tablet Take 1 tablet by mouth 2 (two) times daily.   timolol (TIMOPTIC) 0.5 % ophthalmic solution Place 1 drop into  the left eye 2 (two) times daily.     Allergies:   Patient has no known allergies.   Social History   Socioeconomic History   Marital status: Married    Spouse name: Not on file   Number of children: Not on file   Years of education: Not on file   Highest education level: Not on file  Occupational History   Not on file  Tobacco Use   Smoking status: Former    Current packs/day: 2.00    Average packs/day: 2.0 packs/day for 60.0 years (120.0 ttl pk-yrs)    Types: Cigarettes, Cigars   Smokeless tobacco: Never   Tobacco comments:    smokes 3 cigarettes/day 06/08/2018  Vaping Use   Vaping status: Never Used  Substance and Sexual Activity   Alcohol use: Not Currently   Drug use: Never   Sexual activity: Not on file  Other Topics Concern   Not on file  Social History Narrative   Not on file   Social Drivers of Health   Financial Resource Strain: Low Risk  (07/19/2023)   Overall Financial Resource Strain (CARDIA)    Difficulty of Paying Living Expenses: Not very hard  Food Insecurity: Food Insecurity Present (07/19/2023)   Hunger Vital Sign    Worried About Running Out of Food in the Last Year: Sometimes true    Ran Out of Food in the Last Year: Never true  Transportation Needs: No Transportation Needs (07/19/2023)   PRAPARE - Administrator, Civil Service (Medical): No    Lack of Transportation (Non-Medical): No  Physical Activity: Insufficiently Active (07/19/2023)   Exercise Vital Sign    Days of Exercise per Week: 1 day    Minutes of Exercise per Session: 10 min  Stress: Not on file  Social Connections: Moderately Integrated (07/19/2023)   Social Connection and Isolation Panel [NHANES]    Frequency of Communication with Friends and Family: More than three times a week    Frequency of Social Gatherings with Friends and Family: Once a week    Attends Religious Services: More than 4 times per year    Active Member of [employer] or Organizations: Yes    Attends  Banker Meetings: More than 4 times per year    Marital Status: Widowed     Family History: The patient's family history is not on file. ROS:   Please see the history of present illness.    All 14 point review of systems negative except as described per history of present illness.  EKGs/Labs/Other Studies Reviewed:    The following studies were  reviewed today:   EKG:  EKG Interpretation Date/Time:  Monday November 07 2023 09:28:03 EST Ventricular Rate:  85 PR Interval:  124 QRS Duration:  94 QT Interval:  372 QTC Calculation: 442 R Axis:   -30  Text Interpretation: Normal sinus rhythm Right atrial enlargement Left axis deviation Incomplete right bundle branch block Moderate voltage criteria for LVH, may be normal variant Nonspecific ST abnormality Abnormal ECG When compared with ECG of 06-Jul-2023 13:08, ST more depressed Inferior leads T wave inversion now evident in Inferior leads Nonspecific T wave abnormality no longer evident in Lateral leads Confirmed by Huntley Dec reddy [phone]) on 11/07/2023 9:43:32 AM    Recent Labs: No results found for requested labs within last 365 days.  Recent Lipid Panel No results found for: "CHOL", "TRIG", "HDL", "CHOLHDL", "VLDL", "LDLCALC", "LDLDIRECT"  Physical Exam:    VS:  BP (!) 100/40   Pulse 85   Ht 6' (1.829 m)   Wt 164 lb 3.2 oz (74.5 kg)   SpO2 92%   BMI 22.27 kg/m     Wt Readings from Last 3 Encounters:  11/07/23 164 lb 3.2 oz (74.5 kg)  08/02/23 176 lb (79.8 kg)  07/06/23 179 lb 3.2 oz (81.3 kg)     GENERAL:  Well nourished, well developed in no acute distress NECK: No JVD; No carotid bruits CARDIAC: RRR, S1 and S2 present, no murmurs, no rubs, no gallops CHEST: Bilateral equal air entry with significant diffuse wheezing. Extremities: No pitting pedal edema. Pulses bilaterally symmetric with radial 2+ and dorsalis pedis 2+ NEUROLOGIC:  Alert and oriented x 3  Medication Adjustments/Labs and Tests  Ordered: Current medicines are reviewed at length with the patient today.  Concerns regarding medicines are outlined above.  Orders Placed This Encounter  Procedures   EKG 12-Lead   No orders of the defined types were placed in this encounter.   Signed, Cecille Amsterdam, MD, MPH, [facility]. 11/07/2023 10:02 AM    [facility]

## 2023-11-07 NOTE — Assessment & Plan Note (Signed)
 Stable. Doing well with cardiac rehab. Continue with aspirin 81 mg once daily indefinitely. Continue with Plavix 75 mg once daily and recent of March, continue indefinitely if he does not have any significant symptoms or side effects.  Continue with atorvastatin 80 mg once daily Continue metoprolol tartrate 12.5 mg twice daily.

## 2023-11-07 NOTE — Assessment & Plan Note (Signed)
 History of COPD, follows up with pulmonologist. Significant wheezing bilaterally today. Advised to continue with his albuterol rescue inhaler and nebulizer therapies as needed.

## 2023-11-07 NOTE — Assessment & Plan Note (Signed)
 Well-controlled. Target below 130 over 80 mmHg. Continue metoprolol tartrate 12.5 mg twice daily. In fact he tends to run low with his blood pressure send advised him to hydrate himself well.

## 2023-11-07 NOTE — Patient Instructions (Signed)
 Medication Instructions:  Your physician recommends that you continue on your current medications as directed. Please refer to the Current Medication list given to you today.  *If you need a refill on your cardiac medications before your next appointment, please call your pharmacy*   Lab Work: None Ordered If you have labs (blood work) drawn today and your tests are completely normal, you will receive your results only by: MyChart Message (if you have MyChart) OR A paper copy in the mail If you have any lab test that is abnormal or we need to change your treatment, we will call you to review the results.   Testing/Procedures: None Ordered   Follow-Up: At [facility], you and your health needs are our priority.  As part of our continuing mission to provide you with exceptional heart care, we have created designated Provider Care Teams.  These Care Teams include your primary Cardiologist (physician) and Advanced Practice Providers (APPs -  Physician Assistants and Nurse Practitioners) who all work together to provide you with the care you need, when you need it.  We recommend signing up for the patient portal called "MyChart".  Sign up information is provided on this After Visit Summary.  MyChart is used to connect with patients for Virtual Visits (Telemedicine).  Patients are able to view lab/test results, encounter notes, upcoming appointments, etc.  Non-urgent messages can be sent to your provider as well.   To learn more about what you can do with MyChart, go to [email or web address].    Your next appointment:   6 month follow up

## 2023-11-08 DIAGNOSIS — J449 Chronic obstructive pulmonary disease, unspecified: Secondary | ICD-10-CM | POA: Diagnosis not present

## 2023-11-08 DIAGNOSIS — I5030 Unspecified diastolic (congestive) heart failure: Secondary | ICD-10-CM | POA: Diagnosis not present

## 2023-11-08 DIAGNOSIS — Z6823 Body mass index (BMI) 23.0-23.9, adult: Secondary | ICD-10-CM | POA: Diagnosis not present

## 2023-11-09 DIAGNOSIS — Z955 Presence of coronary angioplasty implant and graft: Secondary | ICD-10-CM | POA: Diagnosis not present

## 2023-11-11 DIAGNOSIS — Z955 Presence of coronary angioplasty implant and graft: Secondary | ICD-10-CM | POA: Diagnosis not present

## 2023-11-14 DIAGNOSIS — Z955 Presence of coronary angioplasty implant and graft: Secondary | ICD-10-CM | POA: Diagnosis not present

## 2023-11-15 DIAGNOSIS — I5030 Unspecified diastolic (congestive) heart failure: Secondary | ICD-10-CM | POA: Diagnosis not present

## 2023-11-18 DIAGNOSIS — Z955 Presence of coronary angioplasty implant and graft: Secondary | ICD-10-CM | POA: Diagnosis not present

## 2023-11-21 DIAGNOSIS — J449 Chronic obstructive pulmonary disease, unspecified: Secondary | ICD-10-CM | POA: Diagnosis not present

## 2023-11-21 DIAGNOSIS — Z955 Presence of coronary angioplasty implant and graft: Secondary | ICD-10-CM | POA: Diagnosis not present

## 2023-11-22 DIAGNOSIS — I5030 Unspecified diastolic (congestive) heart failure: Secondary | ICD-10-CM | POA: Diagnosis not present

## 2023-11-22 DIAGNOSIS — I10 Essential (primary) hypertension: Secondary | ICD-10-CM | POA: Diagnosis not present

## 2023-11-22 DIAGNOSIS — Z6822 Body mass index (BMI) 22.0-22.9, adult: Secondary | ICD-10-CM | POA: Diagnosis not present

## 2023-11-23 DIAGNOSIS — Z955 Presence of coronary angioplasty implant and graft: Secondary | ICD-10-CM | POA: Diagnosis not present

## 2023-11-25 DIAGNOSIS — Z955 Presence of coronary angioplasty implant and graft: Secondary | ICD-10-CM | POA: Diagnosis not present

## 2023-11-29 DIAGNOSIS — I5030 Unspecified diastolic (congestive) heart failure: Secondary | ICD-10-CM | POA: Diagnosis not present

## 2023-11-29 DIAGNOSIS — Z6823 Body mass index (BMI) 23.0-23.9, adult: Secondary | ICD-10-CM | POA: Diagnosis not present

## 2023-11-29 DIAGNOSIS — I10 Essential (primary) hypertension: Secondary | ICD-10-CM | POA: Diagnosis not present

## 2023-12-05 DIAGNOSIS — I5030 Unspecified diastolic (congestive) heart failure: Secondary | ICD-10-CM | POA: Diagnosis not present

## 2023-12-05 DIAGNOSIS — Z6823 Body mass index (BMI) 23.0-23.9, adult: Secondary | ICD-10-CM | POA: Diagnosis not present

## 2023-12-05 DIAGNOSIS — R7303 Prediabetes: Secondary | ICD-10-CM | POA: Diagnosis not present

## 2023-12-05 DIAGNOSIS — R002 Palpitations: Secondary | ICD-10-CM | POA: Diagnosis not present

## 2023-12-12 DIAGNOSIS — I5030 Unspecified diastolic (congestive) heart failure: Secondary | ICD-10-CM | POA: Diagnosis not present

## 2023-12-16 ENCOUNTER — Telehealth: Payer: Self-pay

## 2023-12-16 NOTE — Telephone Encounter (Signed)
 Spoke to [place] at Cardiac Rehab. She reports the patient graduated cardiac rehab on 11/25/2023 and completed 36 sessions. However, patient did not reach discharge goals and he is wanting to repeat the program. If he is able to return the will need a new order, for patient to complete program/. Message sent to Dr. Vincent Gros.

## 2023-12-16 NOTE — Telephone Encounter (Signed)
 Calling to see if we have gotten referral for Cardiac Rehab. Please Advise

## 2023-12-26 DIAGNOSIS — Z955 Presence of coronary angioplasty implant and graft: Secondary | ICD-10-CM | POA: Diagnosis not present

## 2023-12-28 DIAGNOSIS — H905 Unspecified sensorineural hearing loss: Secondary | ICD-10-CM | POA: Diagnosis not present

## 2023-12-28 DIAGNOSIS — Z955 Presence of coronary angioplasty implant and graft: Secondary | ICD-10-CM | POA: Diagnosis not present

## 2023-12-30 DIAGNOSIS — Z955 Presence of coronary angioplasty implant and graft: Secondary | ICD-10-CM | POA: Diagnosis not present

## 2024-01-02 DIAGNOSIS — I5030 Unspecified diastolic (congestive) heart failure: Secondary | ICD-10-CM | POA: Diagnosis not present

## 2024-01-02 DIAGNOSIS — Z955 Presence of coronary angioplasty implant and graft: Secondary | ICD-10-CM | POA: Diagnosis not present

## 2024-01-04 DIAGNOSIS — Z955 Presence of coronary angioplasty implant and graft: Secondary | ICD-10-CM | POA: Diagnosis not present

## 2024-01-04 DIAGNOSIS — I10 Essential (primary) hypertension: Secondary | ICD-10-CM | POA: Diagnosis not present

## 2024-01-05 DIAGNOSIS — I5030 Unspecified diastolic (congestive) heart failure: Secondary | ICD-10-CM | POA: Diagnosis not present

## 2024-01-05 DIAGNOSIS — J449 Chronic obstructive pulmonary disease, unspecified: Secondary | ICD-10-CM | POA: Diagnosis not present

## 2024-01-05 DIAGNOSIS — I10 Essential (primary) hypertension: Secondary | ICD-10-CM | POA: Diagnosis not present

## 2024-01-09 DIAGNOSIS — Z955 Presence of coronary angioplasty implant and graft: Secondary | ICD-10-CM | POA: Diagnosis not present

## 2024-01-09 DIAGNOSIS — I509 Heart failure, unspecified: Secondary | ICD-10-CM | POA: Diagnosis not present

## 2024-01-11 DIAGNOSIS — I509 Heart failure, unspecified: Secondary | ICD-10-CM | POA: Diagnosis not present

## 2024-01-11 DIAGNOSIS — Z955 Presence of coronary angioplasty implant and graft: Secondary | ICD-10-CM | POA: Diagnosis not present

## 2024-01-12 DIAGNOSIS — H5212 Myopia, left eye: Secondary | ICD-10-CM | POA: Diagnosis not present

## 2024-01-12 DIAGNOSIS — H52223 Regular astigmatism, bilateral: Secondary | ICD-10-CM | POA: Diagnosis not present

## 2024-01-12 DIAGNOSIS — H401123 Primary open-angle glaucoma, left eye, severe stage: Secondary | ICD-10-CM | POA: Diagnosis not present

## 2024-01-13 DIAGNOSIS — Z955 Presence of coronary angioplasty implant and graft: Secondary | ICD-10-CM | POA: Diagnosis not present

## 2024-01-13 DIAGNOSIS — I509 Heart failure, unspecified: Secondary | ICD-10-CM | POA: Diagnosis not present

## 2024-01-16 DIAGNOSIS — I5030 Unspecified diastolic (congestive) heart failure: Secondary | ICD-10-CM | POA: Diagnosis not present

## 2024-01-16 DIAGNOSIS — I509 Heart failure, unspecified: Secondary | ICD-10-CM | POA: Diagnosis not present

## 2024-01-16 DIAGNOSIS — Z955 Presence of coronary angioplasty implant and graft: Secondary | ICD-10-CM | POA: Diagnosis not present

## 2024-01-18 DIAGNOSIS — Z955 Presence of coronary angioplasty implant and graft: Secondary | ICD-10-CM | POA: Diagnosis not present

## 2024-01-18 DIAGNOSIS — I509 Heart failure, unspecified: Secondary | ICD-10-CM | POA: Diagnosis not present

## 2024-01-20 DIAGNOSIS — I509 Heart failure, unspecified: Secondary | ICD-10-CM | POA: Diagnosis not present

## 2024-01-20 DIAGNOSIS — Z955 Presence of coronary angioplasty implant and graft: Secondary | ICD-10-CM | POA: Diagnosis not present

## 2024-01-23 DIAGNOSIS — Z955 Presence of coronary angioplasty implant and graft: Secondary | ICD-10-CM | POA: Diagnosis not present

## 2024-01-23 DIAGNOSIS — I5030 Unspecified diastolic (congestive) heart failure: Secondary | ICD-10-CM | POA: Diagnosis not present

## 2024-01-23 DIAGNOSIS — I509 Heart failure, unspecified: Secondary | ICD-10-CM | POA: Diagnosis not present

## 2024-01-25 DIAGNOSIS — I509 Heart failure, unspecified: Secondary | ICD-10-CM | POA: Diagnosis not present

## 2024-01-25 DIAGNOSIS — Z955 Presence of coronary angioplasty implant and graft: Secondary | ICD-10-CM | POA: Diagnosis not present

## 2024-01-27 DIAGNOSIS — I5030 Unspecified diastolic (congestive) heart failure: Secondary | ICD-10-CM | POA: Diagnosis not present

## 2024-02-01 DIAGNOSIS — I509 Heart failure, unspecified: Secondary | ICD-10-CM | POA: Diagnosis not present

## 2024-02-01 DIAGNOSIS — Z955 Presence of coronary angioplasty implant and graft: Secondary | ICD-10-CM | POA: Diagnosis not present

## 2024-02-03 DIAGNOSIS — I509 Heart failure, unspecified: Secondary | ICD-10-CM | POA: Diagnosis not present

## 2024-02-03 DIAGNOSIS — Z955 Presence of coronary angioplasty implant and graft: Secondary | ICD-10-CM | POA: Diagnosis not present

## 2024-02-03 DIAGNOSIS — H401123 Primary open-angle glaucoma, left eye, severe stage: Secondary | ICD-10-CM | POA: Diagnosis not present

## 2024-02-04 DIAGNOSIS — I10 Essential (primary) hypertension: Secondary | ICD-10-CM | POA: Diagnosis not present

## 2024-02-05 DIAGNOSIS — I5030 Unspecified diastolic (congestive) heart failure: Secondary | ICD-10-CM | POA: Diagnosis not present

## 2024-02-05 DIAGNOSIS — I10 Essential (primary) hypertension: Secondary | ICD-10-CM | POA: Diagnosis not present

## 2024-02-06 DIAGNOSIS — Z955 Presence of coronary angioplasty implant and graft: Secondary | ICD-10-CM | POA: Diagnosis not present

## 2024-02-06 DIAGNOSIS — I509 Heart failure, unspecified: Secondary | ICD-10-CM | POA: Diagnosis not present

## 2024-02-10 DIAGNOSIS — I509 Heart failure, unspecified: Secondary | ICD-10-CM | POA: Diagnosis not present

## 2024-02-10 DIAGNOSIS — Z955 Presence of coronary angioplasty implant and graft: Secondary | ICD-10-CM | POA: Diagnosis not present

## 2024-02-13 DIAGNOSIS — I509 Heart failure, unspecified: Secondary | ICD-10-CM | POA: Diagnosis not present

## 2024-02-13 DIAGNOSIS — Z955 Presence of coronary angioplasty implant and graft: Secondary | ICD-10-CM | POA: Diagnosis not present

## 2024-02-15 DIAGNOSIS — Z955 Presence of coronary angioplasty implant and graft: Secondary | ICD-10-CM | POA: Diagnosis not present

## 2024-02-15 DIAGNOSIS — I509 Heart failure, unspecified: Secondary | ICD-10-CM | POA: Diagnosis not present

## 2024-02-17 DIAGNOSIS — I509 Heart failure, unspecified: Secondary | ICD-10-CM | POA: Diagnosis not present

## 2024-02-17 DIAGNOSIS — Z955 Presence of coronary angioplasty implant and graft: Secondary | ICD-10-CM | POA: Diagnosis not present

## 2024-02-20 DIAGNOSIS — Z955 Presence of coronary angioplasty implant and graft: Secondary | ICD-10-CM | POA: Diagnosis not present

## 2024-02-20 DIAGNOSIS — I509 Heart failure, unspecified: Secondary | ICD-10-CM | POA: Diagnosis not present

## 2024-02-21 DIAGNOSIS — J449 Chronic obstructive pulmonary disease, unspecified: Secondary | ICD-10-CM | POA: Diagnosis not present

## 2024-02-22 DIAGNOSIS — Z955 Presence of coronary angioplasty implant and graft: Secondary | ICD-10-CM | POA: Diagnosis not present

## 2024-02-22 DIAGNOSIS — I509 Heart failure, unspecified: Secondary | ICD-10-CM | POA: Diagnosis not present

## 2024-02-24 DIAGNOSIS — Z955 Presence of coronary angioplasty implant and graft: Secondary | ICD-10-CM | POA: Diagnosis not present

## 2024-02-24 DIAGNOSIS — I509 Heart failure, unspecified: Secondary | ICD-10-CM | POA: Diagnosis not present

## 2024-02-27 DIAGNOSIS — Z955 Presence of coronary angioplasty implant and graft: Secondary | ICD-10-CM | POA: Diagnosis not present

## 2024-02-27 DIAGNOSIS — I509 Heart failure, unspecified: Secondary | ICD-10-CM | POA: Diagnosis not present

## 2024-03-02 DIAGNOSIS — Z955 Presence of coronary angioplasty implant and graft: Secondary | ICD-10-CM | POA: Diagnosis not present

## 2024-03-02 DIAGNOSIS — I509 Heart failure, unspecified: Secondary | ICD-10-CM | POA: Diagnosis not present

## 2024-03-05 DIAGNOSIS — I10 Essential (primary) hypertension: Secondary | ICD-10-CM | POA: Diagnosis not present

## 2024-03-05 DIAGNOSIS — I509 Heart failure, unspecified: Secondary | ICD-10-CM | POA: Diagnosis not present

## 2024-03-05 DIAGNOSIS — Z955 Presence of coronary angioplasty implant and graft: Secondary | ICD-10-CM | POA: Diagnosis not present

## 2024-03-06 DIAGNOSIS — M6281 Muscle weakness (generalized): Secondary | ICD-10-CM | POA: Diagnosis not present

## 2024-03-06 DIAGNOSIS — R202 Paresthesia of skin: Secondary | ICD-10-CM | POA: Diagnosis not present

## 2024-03-06 DIAGNOSIS — I5030 Unspecified diastolic (congestive) heart failure: Secondary | ICD-10-CM | POA: Diagnosis not present

## 2024-03-06 DIAGNOSIS — M24542 Contracture, left hand: Secondary | ICD-10-CM | POA: Diagnosis not present

## 2024-03-06 DIAGNOSIS — I10 Essential (primary) hypertension: Secondary | ICD-10-CM | POA: Diagnosis not present

## 2024-03-06 DIAGNOSIS — M24541 Contracture, right hand: Secondary | ICD-10-CM | POA: Diagnosis not present

## 2024-03-07 DIAGNOSIS — Z955 Presence of coronary angioplasty implant and graft: Secondary | ICD-10-CM | POA: Diagnosis not present

## 2024-03-07 DIAGNOSIS — I509 Heart failure, unspecified: Secondary | ICD-10-CM | POA: Diagnosis not present

## 2024-03-08 DIAGNOSIS — I25119 Atherosclerotic heart disease of native coronary artery with unspecified angina pectoris: Secondary | ICD-10-CM | POA: Diagnosis not present

## 2024-03-12 DIAGNOSIS — I509 Heart failure, unspecified: Secondary | ICD-10-CM | POA: Diagnosis not present

## 2024-03-12 DIAGNOSIS — Z955 Presence of coronary angioplasty implant and graft: Secondary | ICD-10-CM | POA: Diagnosis not present

## 2024-03-13 DIAGNOSIS — R202 Paresthesia of skin: Secondary | ICD-10-CM | POA: Diagnosis not present

## 2024-03-13 DIAGNOSIS — M24541 Contracture, right hand: Secondary | ICD-10-CM | POA: Diagnosis not present

## 2024-03-13 DIAGNOSIS — M24542 Contracture, left hand: Secondary | ICD-10-CM | POA: Diagnosis not present

## 2024-03-13 DIAGNOSIS — M6281 Muscle weakness (generalized): Secondary | ICD-10-CM | POA: Diagnosis not present

## 2024-03-14 DIAGNOSIS — I509 Heart failure, unspecified: Secondary | ICD-10-CM | POA: Diagnosis not present

## 2024-03-14 DIAGNOSIS — Z955 Presence of coronary angioplasty implant and graft: Secondary | ICD-10-CM | POA: Diagnosis not present

## 2024-03-15 DIAGNOSIS — I5030 Unspecified diastolic (congestive) heart failure: Secondary | ICD-10-CM | POA: Diagnosis not present

## 2024-03-15 DIAGNOSIS — M24542 Contracture, left hand: Secondary | ICD-10-CM | POA: Diagnosis not present

## 2024-03-15 DIAGNOSIS — R202 Paresthesia of skin: Secondary | ICD-10-CM | POA: Diagnosis not present

## 2024-03-15 DIAGNOSIS — M6281 Muscle weakness (generalized): Secondary | ICD-10-CM | POA: Diagnosis not present

## 2024-03-15 DIAGNOSIS — M24541 Contracture, right hand: Secondary | ICD-10-CM | POA: Diagnosis not present

## 2024-03-16 DIAGNOSIS — E538 Deficiency of other specified B group vitamins: Secondary | ICD-10-CM | POA: Diagnosis not present

## 2024-03-16 DIAGNOSIS — D649 Anemia, unspecified: Secondary | ICD-10-CM | POA: Diagnosis not present

## 2024-03-16 DIAGNOSIS — I25119 Atherosclerotic heart disease of native coronary artery with unspecified angina pectoris: Secondary | ICD-10-CM | POA: Diagnosis not present

## 2024-03-16 DIAGNOSIS — I5030 Unspecified diastolic (congestive) heart failure: Secondary | ICD-10-CM | POA: Diagnosis not present

## 2024-03-16 DIAGNOSIS — Z6821 Body mass index (BMI) 21.0-21.9, adult: Secondary | ICD-10-CM | POA: Diagnosis not present

## 2024-03-16 DIAGNOSIS — I10 Essential (primary) hypertension: Secondary | ICD-10-CM | POA: Diagnosis not present

## 2024-03-19 DIAGNOSIS — I509 Heart failure, unspecified: Secondary | ICD-10-CM | POA: Diagnosis not present

## 2024-03-19 DIAGNOSIS — Z955 Presence of coronary angioplasty implant and graft: Secondary | ICD-10-CM | POA: Diagnosis not present

## 2024-03-20 DIAGNOSIS — M6281 Muscle weakness (generalized): Secondary | ICD-10-CM | POA: Diagnosis not present

## 2024-03-20 DIAGNOSIS — R202 Paresthesia of skin: Secondary | ICD-10-CM | POA: Diagnosis not present

## 2024-03-20 DIAGNOSIS — M24542 Contracture, left hand: Secondary | ICD-10-CM | POA: Diagnosis not present

## 2024-03-20 DIAGNOSIS — M24541 Contracture, right hand: Secondary | ICD-10-CM | POA: Diagnosis not present

## 2024-03-21 DIAGNOSIS — I509 Heart failure, unspecified: Secondary | ICD-10-CM | POA: Diagnosis not present

## 2024-03-21 DIAGNOSIS — Z955 Presence of coronary angioplasty implant and graft: Secondary | ICD-10-CM | POA: Diagnosis not present

## 2024-03-22 DIAGNOSIS — J449 Chronic obstructive pulmonary disease, unspecified: Secondary | ICD-10-CM | POA: Diagnosis not present

## 2024-03-22 DIAGNOSIS — M6281 Muscle weakness (generalized): Secondary | ICD-10-CM | POA: Diagnosis not present

## 2024-03-22 DIAGNOSIS — M24541 Contracture, right hand: Secondary | ICD-10-CM | POA: Diagnosis not present

## 2024-03-22 DIAGNOSIS — R202 Paresthesia of skin: Secondary | ICD-10-CM | POA: Diagnosis not present

## 2024-03-22 DIAGNOSIS — M24542 Contracture, left hand: Secondary | ICD-10-CM | POA: Diagnosis not present

## 2024-03-23 DIAGNOSIS — Z955 Presence of coronary angioplasty implant and graft: Secondary | ICD-10-CM | POA: Diagnosis not present

## 2024-03-23 DIAGNOSIS — I509 Heart failure, unspecified: Secondary | ICD-10-CM | POA: Diagnosis not present

## 2024-03-27 DIAGNOSIS — R202 Paresthesia of skin: Secondary | ICD-10-CM | POA: Diagnosis not present

## 2024-03-27 DIAGNOSIS — M6281 Muscle weakness (generalized): Secondary | ICD-10-CM | POA: Diagnosis not present

## 2024-03-27 DIAGNOSIS — I509 Heart failure, unspecified: Secondary | ICD-10-CM | POA: Diagnosis not present

## 2024-03-27 DIAGNOSIS — M24541 Contracture, right hand: Secondary | ICD-10-CM | POA: Diagnosis not present

## 2024-03-27 DIAGNOSIS — M24542 Contracture, left hand: Secondary | ICD-10-CM | POA: Diagnosis not present

## 2024-03-27 DIAGNOSIS — Z955 Presence of coronary angioplasty implant and graft: Secondary | ICD-10-CM | POA: Diagnosis not present

## 2024-03-28 DIAGNOSIS — Z955 Presence of coronary angioplasty implant and graft: Secondary | ICD-10-CM | POA: Diagnosis not present

## 2024-03-28 DIAGNOSIS — I509 Heart failure, unspecified: Secondary | ICD-10-CM | POA: Diagnosis not present

## 2024-03-29 DIAGNOSIS — F17210 Nicotine dependence, cigarettes, uncomplicated: Secondary | ICD-10-CM | POA: Diagnosis not present

## 2024-03-29 DIAGNOSIS — I25119 Atherosclerotic heart disease of native coronary artery with unspecified angina pectoris: Secondary | ICD-10-CM | POA: Diagnosis not present

## 2024-03-29 DIAGNOSIS — Z20822 Contact with and (suspected) exposure to covid-19: Secondary | ICD-10-CM | POA: Diagnosis not present

## 2024-03-29 DIAGNOSIS — R918 Other nonspecific abnormal finding of lung field: Secondary | ICD-10-CM | POA: Diagnosis not present

## 2024-03-29 DIAGNOSIS — J441 Chronic obstructive pulmonary disease with (acute) exacerbation: Secondary | ICD-10-CM | POA: Diagnosis not present

## 2024-03-29 DIAGNOSIS — M6281 Muscle weakness (generalized): Secondary | ICD-10-CM | POA: Diagnosis not present

## 2024-03-29 DIAGNOSIS — M24541 Contracture, right hand: Secondary | ICD-10-CM | POA: Diagnosis not present

## 2024-03-29 DIAGNOSIS — R059 Cough, unspecified: Secondary | ICD-10-CM | POA: Diagnosis not present

## 2024-03-29 DIAGNOSIS — M24542 Contracture, left hand: Secondary | ICD-10-CM | POA: Diagnosis not present

## 2024-03-29 DIAGNOSIS — Z6821 Body mass index (BMI) 21.0-21.9, adult: Secondary | ICD-10-CM | POA: Diagnosis not present

## 2024-03-29 DIAGNOSIS — R202 Paresthesia of skin: Secondary | ICD-10-CM | POA: Diagnosis not present

## 2024-03-29 DIAGNOSIS — I2510 Atherosclerotic heart disease of native coronary artery without angina pectoris: Secondary | ICD-10-CM | POA: Diagnosis not present

## 2024-03-30 DIAGNOSIS — Z955 Presence of coronary angioplasty implant and graft: Secondary | ICD-10-CM | POA: Diagnosis not present

## 2024-03-30 DIAGNOSIS — I509 Heart failure, unspecified: Secondary | ICD-10-CM | POA: Diagnosis not present

## 2024-04-02 DIAGNOSIS — Z6821 Body mass index (BMI) 21.0-21.9, adult: Secondary | ICD-10-CM | POA: Diagnosis not present

## 2024-04-02 DIAGNOSIS — J449 Chronic obstructive pulmonary disease, unspecified: Secondary | ICD-10-CM | POA: Diagnosis not present

## 2024-04-05 DIAGNOSIS — M24542 Contracture, left hand: Secondary | ICD-10-CM | POA: Diagnosis not present

## 2024-04-05 DIAGNOSIS — M24541 Contracture, right hand: Secondary | ICD-10-CM | POA: Diagnosis not present

## 2024-04-05 DIAGNOSIS — R202 Paresthesia of skin: Secondary | ICD-10-CM | POA: Diagnosis not present

## 2024-04-05 DIAGNOSIS — M6281 Muscle weakness (generalized): Secondary | ICD-10-CM | POA: Diagnosis not present

## 2024-04-05 DIAGNOSIS — I10 Essential (primary) hypertension: Secondary | ICD-10-CM | POA: Diagnosis not present

## 2024-04-06 DIAGNOSIS — I10 Essential (primary) hypertension: Secondary | ICD-10-CM | POA: Diagnosis not present

## 2024-04-06 DIAGNOSIS — I25119 Atherosclerotic heart disease of native coronary artery with unspecified angina pectoris: Secondary | ICD-10-CM | POA: Diagnosis not present

## 2024-04-16 DIAGNOSIS — J441 Chronic obstructive pulmonary disease with (acute) exacerbation: Secondary | ICD-10-CM | POA: Diagnosis not present

## 2024-04-16 DIAGNOSIS — Z6821 Body mass index (BMI) 21.0-21.9, adult: Secondary | ICD-10-CM | POA: Diagnosis not present

## 2024-04-16 DIAGNOSIS — R059 Cough, unspecified: Secondary | ICD-10-CM | POA: Diagnosis not present

## 2024-04-16 DIAGNOSIS — J449 Chronic obstructive pulmonary disease, unspecified: Secondary | ICD-10-CM | POA: Diagnosis not present

## 2024-04-17 DIAGNOSIS — M24541 Contracture, right hand: Secondary | ICD-10-CM | POA: Diagnosis not present

## 2024-04-17 DIAGNOSIS — R202 Paresthesia of skin: Secondary | ICD-10-CM | POA: Diagnosis not present

## 2024-04-17 DIAGNOSIS — M24542 Contracture, left hand: Secondary | ICD-10-CM | POA: Diagnosis not present

## 2024-04-17 DIAGNOSIS — M6281 Muscle weakness (generalized): Secondary | ICD-10-CM | POA: Diagnosis not present

## 2024-04-19 DIAGNOSIS — J449 Chronic obstructive pulmonary disease, unspecified: Secondary | ICD-10-CM | POA: Diagnosis not present

## 2024-04-19 DIAGNOSIS — Z6820 Body mass index (BMI) 20.0-20.9, adult: Secondary | ICD-10-CM | POA: Diagnosis not present

## 2024-04-22 DIAGNOSIS — J449 Chronic obstructive pulmonary disease, unspecified: Secondary | ICD-10-CM | POA: Diagnosis not present

## 2024-04-24 DIAGNOSIS — M24541 Contracture, right hand: Secondary | ICD-10-CM | POA: Diagnosis not present

## 2024-04-24 DIAGNOSIS — M24542 Contracture, left hand: Secondary | ICD-10-CM | POA: Diagnosis not present

## 2024-04-24 DIAGNOSIS — R202 Paresthesia of skin: Secondary | ICD-10-CM | POA: Diagnosis not present

## 2024-04-24 DIAGNOSIS — M6281 Muscle weakness (generalized): Secondary | ICD-10-CM | POA: Diagnosis not present

## 2024-04-26 DIAGNOSIS — M24542 Contracture, left hand: Secondary | ICD-10-CM | POA: Diagnosis not present

## 2024-04-26 DIAGNOSIS — M24541 Contracture, right hand: Secondary | ICD-10-CM | POA: Diagnosis not present

## 2024-04-26 DIAGNOSIS — M6281 Muscle weakness (generalized): Secondary | ICD-10-CM | POA: Diagnosis not present

## 2024-04-26 DIAGNOSIS — R202 Paresthesia of skin: Secondary | ICD-10-CM | POA: Diagnosis not present

## 2024-05-01 DIAGNOSIS — M24541 Contracture, right hand: Secondary | ICD-10-CM | POA: Diagnosis not present

## 2024-05-01 DIAGNOSIS — R202 Paresthesia of skin: Secondary | ICD-10-CM | POA: Diagnosis not present

## 2024-05-01 DIAGNOSIS — M24542 Contracture, left hand: Secondary | ICD-10-CM | POA: Diagnosis not present

## 2024-05-01 DIAGNOSIS — M6281 Muscle weakness (generalized): Secondary | ICD-10-CM | POA: Diagnosis not present

## 2024-05-06 DIAGNOSIS — I10 Essential (primary) hypertension: Secondary | ICD-10-CM | POA: Diagnosis not present

## 2024-05-07 DIAGNOSIS — J441 Chronic obstructive pulmonary disease with (acute) exacerbation: Secondary | ICD-10-CM | POA: Diagnosis not present

## 2024-05-07 DIAGNOSIS — I10 Essential (primary) hypertension: Secondary | ICD-10-CM | POA: Diagnosis not present

## 2024-05-15 DIAGNOSIS — R202 Paresthesia of skin: Secondary | ICD-10-CM | POA: Diagnosis not present

## 2024-05-15 DIAGNOSIS — M24541 Contracture, right hand: Secondary | ICD-10-CM | POA: Diagnosis not present

## 2024-05-15 DIAGNOSIS — M6281 Muscle weakness (generalized): Secondary | ICD-10-CM | POA: Diagnosis not present

## 2024-05-15 DIAGNOSIS — M24542 Contracture, left hand: Secondary | ICD-10-CM | POA: Diagnosis not present

## 2024-05-17 DIAGNOSIS — M24541 Contracture, right hand: Secondary | ICD-10-CM | POA: Diagnosis not present

## 2024-05-17 DIAGNOSIS — R202 Paresthesia of skin: Secondary | ICD-10-CM | POA: Diagnosis not present

## 2024-05-17 DIAGNOSIS — M6281 Muscle weakness (generalized): Secondary | ICD-10-CM | POA: Diagnosis not present

## 2024-05-17 DIAGNOSIS — M24542 Contracture, left hand: Secondary | ICD-10-CM | POA: Diagnosis not present

## 2024-05-23 DIAGNOSIS — J449 Chronic obstructive pulmonary disease, unspecified: Secondary | ICD-10-CM | POA: Diagnosis not present

## 2024-05-28 DIAGNOSIS — M79642 Pain in left hand: Secondary | ICD-10-CM | POA: Diagnosis not present

## 2024-05-28 DIAGNOSIS — M79641 Pain in right hand: Secondary | ICD-10-CM | POA: Diagnosis not present

## 2024-05-28 DIAGNOSIS — R200 Anesthesia of skin: Secondary | ICD-10-CM | POA: Diagnosis not present

## 2024-06-01 DIAGNOSIS — M79641 Pain in right hand: Secondary | ICD-10-CM | POA: Diagnosis not present

## 2024-06-05 DIAGNOSIS — I10 Essential (primary) hypertension: Secondary | ICD-10-CM | POA: Diagnosis not present

## 2024-06-06 DIAGNOSIS — J441 Chronic obstructive pulmonary disease with (acute) exacerbation: Secondary | ICD-10-CM | POA: Diagnosis not present

## 2024-06-06 DIAGNOSIS — I10 Essential (primary) hypertension: Secondary | ICD-10-CM | POA: Diagnosis not present

## 2024-06-12 DIAGNOSIS — Z23 Encounter for immunization: Secondary | ICD-10-CM | POA: Diagnosis not present

## 2024-06-13 DIAGNOSIS — H401123 Primary open-angle glaucoma, left eye, severe stage: Secondary | ICD-10-CM | POA: Diagnosis not present

## 2024-06-20 DIAGNOSIS — J449 Chronic obstructive pulmonary disease, unspecified: Secondary | ICD-10-CM | POA: Diagnosis not present

## 2024-06-20 DIAGNOSIS — F17210 Nicotine dependence, cigarettes, uncomplicated: Secondary | ICD-10-CM | POA: Diagnosis not present

## 2024-06-20 DIAGNOSIS — R918 Other nonspecific abnormal finding of lung field: Secondary | ICD-10-CM | POA: Diagnosis not present

## 2024-06-20 DIAGNOSIS — I2510 Atherosclerotic heart disease of native coronary artery without angina pectoris: Secondary | ICD-10-CM | POA: Diagnosis not present

## 2024-06-22 DIAGNOSIS — J449 Chronic obstructive pulmonary disease, unspecified: Secondary | ICD-10-CM | POA: Diagnosis not present

## 2024-07-03 ENCOUNTER — Other Ambulatory Visit: Payer: Self-pay

## 2024-07-05 ENCOUNTER — Ambulatory Visit

## 2024-07-06 DIAGNOSIS — I10 Essential (primary) hypertension: Secondary | ICD-10-CM | POA: Diagnosis not present

## 2024-07-06 DIAGNOSIS — H401123 Primary open-angle glaucoma, left eye, severe stage: Secondary | ICD-10-CM | POA: Diagnosis not present

## 2024-07-07 DIAGNOSIS — I10 Essential (primary) hypertension: Secondary | ICD-10-CM | POA: Diagnosis not present

## 2024-07-07 DIAGNOSIS — I5030 Unspecified diastolic (congestive) heart failure: Secondary | ICD-10-CM | POA: Diagnosis not present

## 2024-07-25 DIAGNOSIS — R079 Chest pain, unspecified: Secondary | ICD-10-CM | POA: Diagnosis not present

## 2024-07-26 ENCOUNTER — Ambulatory Visit

## 2024-07-26 VITALS — BP 112/58 | HR 64 | Ht 72.0 in | Wt 144.6 lb

## 2024-07-26 DIAGNOSIS — I10 Essential (primary) hypertension: Secondary | ICD-10-CM | POA: Diagnosis not present

## 2024-07-26 DIAGNOSIS — I25118 Atherosclerotic heart disease of native coronary artery with other forms of angina pectoris: Secondary | ICD-10-CM

## 2024-07-26 DIAGNOSIS — Z720 Tobacco use: Secondary | ICD-10-CM

## 2024-07-26 DIAGNOSIS — E782 Mixed hyperlipidemia: Secondary | ICD-10-CM

## 2024-07-26 DIAGNOSIS — I351 Nonrheumatic aortic (valve) insufficiency: Secondary | ICD-10-CM

## 2024-07-26 DIAGNOSIS — E785 Hyperlipidemia, unspecified: Secondary | ICD-10-CM | POA: Insufficient documentation

## 2024-07-26 NOTE — Assessment & Plan Note (Signed)
 Continues to smoke cigars occasionally. Strongly recommended him to quit.

## 2024-07-26 NOTE — Patient Instructions (Signed)
 Medication Instructions:  Your physician recommends that you continue on your current medications as directed. Please refer to the Current Medication list given to you today.  *If you need a refill on your cardiac medications before your next appointment, please call your pharmacy*  Lab Work: None If you have labs (blood work) drawn today and your tests are completely normal, you will receive your results only by: MyChart Message (if you have MyChart) OR A paper copy in the mail If you have any lab test that is abnormal or we need to change your treatment, we will call you to review the results.  Testing/Procedures: Your physician has requested that you have an echocardiogram. Echocardiography is a painless test that uses sound waves to create images of your heart. It provides your doctor with information about the size and shape of your heart and how well your heart's chambers and valves are working. This procedure takes approximately one hour. There are no restrictions for this procedure. Please do NOT wear cologne, perfume, aftershave, or lotions (deodorant is allowed). Please arrive 15 minutes prior to your appointment time.  Please note: We ask at that you not bring children with you during ultrasound (echo/ vascular) testing. Due to room size and safety concerns, children are not allowed in the ultrasound rooms during exams. Our front office staff cannot provide observation of children in our lobby area while testing is being conducted. An adult accompanying a patient to their appointment will only be allowed in the ultrasound room at the discretion of the ultrasound technician under special circumstances. We apologize for any inconvenience.   Follow-Up: At [facility], you and your health needs are our priority.  As part of our continuing mission to provide you with exceptional heart care, our providers are all part of one team.  This team includes your primary Cardiologist  (physician) and Advanced Practice Providers or APPs (Physician Assistants and Nurse Practitioners) who all work together to provide you with the care you need, when you need it.  Your next appointment:   5 month(s)  Provider:   Huntley Dec, MD    We recommend signing up for the patient portal called "MyChart".  Sign up information is provided on this After Visit Summary.  MyChart is used to connect with patients for Virtual Visits (Telemedicine).  Patients are able to view lab/test results, encounter notes, upcoming appointments, etc.  Non-urgent messages can be sent to your provider as well.   To learn more about what you can do with MyChart, go to [email or web address].   Other Instructions None

## 2024-07-26 NOTE — Assessment & Plan Note (Signed)
 Will continue to follow-up for any significant interval change on repeat echocardiogram being done.

## 2024-07-26 NOTE — Assessment & Plan Note (Signed)
 Well-controlled. Continue current medications metoprolol to tartrate 12.5 mg twice daily

## 2024-07-26 NOTE — Assessment & Plan Note (Signed)
 s/p NSTEMI September 2024 At [facility] showing subtotal occlusion of ostial RCA with distal left-to-right collaterals, moderate LAD and LCx disease unsuccessful PCI attempts of RCA [high risk PCI with orbital atherectomy versus medical therapy discussed and patient preferred medical therapy]  Now s/p Lexiscan  stress nuclear imaging 07/25/2024 at [facility] small area of apical septal ischemia.  Gated SPECT images LVEF 56% with normal wall motion.  Fixed perfusion defect basal to apical inferior segments felt to be an attenuation artifact.  His symptoms are atypical and mostly short of breath with exertion which has been chronic and no significant change. Symptoms do not appear anginal. He does have significant bilateral wheezing on auscultation.  At this time do not see need for further invasive coronary artery disease testing.  Will obtain transthoracic echocardiogram to rule out any cardiac structural and functional abnormalities or wall motion abnormalities.  Continue dual antiplatelet therapy with aspirin  81 mg and Plavix  75 mg once daily. Continue atorvastatin 80 mg once daily

## 2024-07-26 NOTE — Progress Notes (Signed)
 Cardiology Consultation:    Date:  07/26/2024   ID:  Daniel Tran, DOB 12-Sep-1945, MRN [id number]  PCP:  Daniel Hun, MD  Cardiologist:  Daniel SAUNDERS Tiffany Calmes, MD   Referring MD: Daniel Hun, MD   No chief complaint on file.    ASSESSMENT AND PLAN:   Daniel Tran 78 year old male with history of CAD s/p NSTEMI September 2024 At [facility] showing subtotal occlusion of ostial RCA with distal left-to-right collaterals, moderate LAD and LCx disease unsuccessful PCI attempts of RCA [high risk PCI with orbital atherectomy versus medical therapy discussed and patient preferred medical therapy], hypertension, mild TR, mild aortic insufficiency, smoking, COPD on 4 L/min nocturnal O2, mild dementia, blind in right eye, poor overall insight into his health condition, medications are picked up prepackaged on a weekly basis from PCPs office.  Echocardiogram last available to review from 07/14/2023 at [facility] mild concentric LVH with normal biventricular function, LVEF 55 to 60%, mild aortic insufficiency.     Appears to be at his baseline, Lexiscan  stress with nuclear imaging ordered by PCP done at [facility] 07/25/2024 shows small area of ischemia involving apical septal segment.  There is fixed perfusion defect basal to apical inferior segments with normal wall motion suggestive of diaphragmatic attenuation.  Gated SPECT images LVEF 56%, normal wall motion.  Overall low risk study.  Problem List Items Addressed This Visit     Tobacco abuse   Continues to smoke cigars occasionally. Strongly recommended him to quit.       Relevant Orders   ECHOCARDIOGRAM COMPLETE   CAD, S/p NSTEMI September 2024 05/23/2023 at [facility], subtotal occlusion of RCA, unsuccessful PCI - Primary    s/p NSTEMI September 2024 At [facility] showing subtotal occlusion of ostial RCA with distal left-to-right collaterals, moderate LAD and LCx disease unsuccessful PCI attempts of RCA [high  risk PCI with orbital atherectomy versus medical therapy discussed and patient preferred medical therapy]  Now s/p Lexiscan  stress nuclear imaging 07/25/2024 at [facility] small area of apical septal ischemia.  Gated SPECT images LVEF 56% with normal wall motion.  Fixed perfusion defect basal to apical inferior segments felt to be an attenuation artifact.  His symptoms are atypical and mostly short of breath with exertion which has been chronic and no significant change. Symptoms do not appear anginal. He does have significant bilateral wheezing on auscultation.  At this time do not see need for further invasive coronary artery disease testing.  Will obtain transthoracic echocardiogram to rule out any cardiac structural and functional abnormalities or wall motion abnormalities.  Continue dual antiplatelet therapy with aspirin  81 mg and Plavix  75 mg once daily. Continue atorvastatin 80 mg once daily      Relevant Orders   ECHOCARDIOGRAM COMPLETE   Hypertension, benign   Well-controlled. Continue current medications metoprolol to tartrate 12.5 mg twice daily       Relevant Orders   ECHOCARDIOGRAM COMPLETE   Mild aortic insufficiency, by TTE 07/14/2023 at [facility]   Will continue to follow-up for any significant interval change on repeat echocardiogram being done.       Relevant Orders   ECHOCARDIOGRAM COMPLETE   Hyperlipidemia   Recent blood work from [facility] 07/19/2024 reviewed Lipid panel total cholesterol 145, triglycerides 54, HDL 84 and LDL 49.  Optimal.   Continue atorvastatin 80 mg once daily.       Return to clinic tentatively in 5 months. Will review echocardiogram results once available.  History of Present Illness:    Daniel Tran is a 78 y.o. male who is being seen today for follow-up visit. PCP is Daniel Hun, MD. Last visit with me in the office was 11/07/2023.  history of CAD s/p NSTEMI September 2024 At [facility] showing  subtotal occlusion of ostial RCA with distal left-to-right collaterals, moderate LAD and LCx disease unsuccessful PCI attempts of RCA [high risk PCI with orbital atherectomy versus medical therapy discussed and patient preferred medical therapy], hypertension, mild TR, mild aortic insufficiency, smoking, COPD on 4 L/min nocturnal O2, mild dementia, blind in right eye, poor overall insight into his health condition, medications are picked up prepackaged on a weekly basis from PCPs office.  Echocardiogram last available to review from 07/14/2023 at [facility] mild concentric LVH with normal biventricular function, LVEF 55 to 60%, mild aortic insufficiency.    At home lives by himself but recently his and his daughter lives nearby and is able to help him out.  He is in the process of trying to have some level of home health set up.  Recently had follow-up with his PCP and pulmonologist. Had a Lexiscan  stress with nuclear imaging completed 07/25/2024 at [facility].  Study showed no abnormal EKG changes suggestive of ischemia.  Nuclear imaging showed fixed perfusion defect involving basal to apical inferior segments and normal wall motion consistent with diaphragmatic attenuation.  Small reversible perfusion defect involving apical septal segment reported suggestive of small area of ischemia.  Gated SPECT images with LVEF 56%.  Mentions overall he feels well at baseline with symptoms of shortness of breath which are not significantly different from his baseline a year ago. Does continue to report wheezing through the day for [name] in the morning.  Eases up through the day.  Uses oxygen  through the night by nasal cannula. Denies any chest pain.  Denies any palpitations, lightheadedness, dizziness or syncopal episodes. Denies any blood in urine or stools.  Has dependent bilateral lower extremity edema that has been well-controlled with compression socks.  He uses furosemide  20 mg  once daily. Weight 5 pounds lower than in August.  Good compliance with his medications that he picks up from PCPs office every Monday in the prepackaged form.  Recent blood work from [facility] 07/19/2024 reviewed Lipid panel total cholesterol 145, triglycerides 54, HDL 84 and LDL 49.  Optimal. Normal transaminases and alkaline phosphatase BUN 16, creatinine 0.86, eGFR 88 Sodium 142 and potassium 3.6 Hemoglobin 13.6, hematocrit 40.4, WBC 7.2 platelets 237.   Past Medical History:  Diagnosis Date   Bilateral lower extremity edema 08/02/2023   CAD, S/p NSTEMI September 2024 05/23/2023 at [facility], subtotal occlusion of RCA, unsuccessful PCI 07/06/2023   COPD (chronic obstructive pulmonary disease) (HCC)    Elevated troponin 05/15/2023   Glaucoma    Hypertension, benign    Macrocytosis 05/21/2023   Mild aortic insufficiency, by TTE 07/14/2023 at [facility] 08/02/2023   NSTEMI (non-ST elevated myocardial infarction) (HCC) 05/22/2023   On home oxygen  therapy 05/15/2023   Pulmonary nodule, left 12/21/2017   CT 2016 LLL 6mm   Respiratory failure (HCC) 05/20/2023   Seasonal allergic rhinitis    Thrush 05/19/2023   Tobacco abuse 12/21/2017    Past Surgical History:  Procedure Laterality Date   EYE SURGERY Bilateral 04/11/2019   Ectropion repair by lateral tarsal strip & qickert bilateral eyelids & punctoplasty bilateral lower puncta   EYE SURGERY Bilateral 04/11/2019   Snip incision of lacrimal punctum; Surgeon:  Ashley Greig Jansky, MD; Location: [facility]   HERNIA REPAIR     inguinal    Current Medications: Current Meds  Medication Sig   acetaminophen  (TYLENOL ) 650 MG CR tablet Take 650 mg by mouth every 8 (eight) hours as needed for pain.   albuterol  (PROVENTIL ) (2.5 MG/3ML) 0.083% nebulizer solution Take 2.5 mg by nebulization every 6 (six) hours as needed for wheezing or shortness of breath.   albuterol  (VENTOLIN  HFA) 108 (90 Base) MCG/ACT inhaler Inhale 1-2 puffs into  the lungs every 4 (four) hours as needed for wheezing or shortness of breath.   aspirin  81 MG chewable tablet Chew 81 mg by mouth daily.   atorvastatin (LIPITOR) 80 MG tablet Take 80 mg by mouth daily.   B-12 MICROLOZENGE 500 MCG SUBL Take 2 tablets by mouth daily.   clopidogrel  (PLAVIX ) 75 MG tablet Take 1 tablet (75 mg total) by mouth daily.   furosemide  (LASIX ) 20 MG tablet Take 20 mg by mouth daily.   gabapentin (NEURONTIN) 400 MG capsule Take 400 mg by mouth 3 (three) times daily.   ipratropium-albuterol  (DUONEB) 0.5-2.5 (3) MG/3ML SOLN Take 3 mLs by nebulization every 6 (six) hours as needed (shortness of breath).   JARDIANCE 10 MG TABS tablet Take 10 mg by mouth daily.   metoprolol tartrate (LOPRESSOR) 25 MG tablet Take 12.5 mg by mouth 2 (two) times daily.   Multiple Vitamin (MULTIVITAMIN) tablet Take 1 tablet by mouth daily.   omega-3 fish oil (MAXEPA) 1000 MG CAPS capsule Take 1 capsule by mouth 2 (two) times daily.   omeprazole (PRILOSEC) 40 MG capsule Take 40 mg by mouth daily.   SENNA PLUS 8.6-50 MG tablet Take 1 tablet by mouth 2 (two) times daily.   TRELEGY ELLIPTA 200-62.5-25 MCG/ACT AEPB Inhale 1 puff into the lungs daily.     Allergies:   Patient has no known allergies.   Social History   Socioeconomic History   Marital status: Married    Spouse name: Not on file   Number of children: Not on file   Years of education: Not on file   Highest education level: Not on file  Occupational History   Not on file  Tobacco Use   Smoking status: Former    Current packs/day: 2.00    Average packs/day: 2.0 packs/day for 60.0 years (120.0 ttl pk-yrs)    Types: Cigarettes, Cigars   Smokeless tobacco: Never   Tobacco comments:    smokes 3 cigarettes/day 06/08/2018  Vaping Use   Vaping status: Never Used  Substance and Sexual Activity   Alcohol use: Not Currently   Drug use: Never   Sexual activity: Not on file  Other Topics Concern   Not on file  Social History Narrative    Not on file   Social Drivers of Health   Financial Resource Strain: Low Risk  (07/19/2023)   Overall Financial Resource Strain (CARDIA)    Difficulty of Paying Living Expenses: Not very hard  Food Insecurity: Food Insecurity Present (07/19/2023)   Hunger Vital Sign    Worried About Running Out of Food in the Last Year: Sometimes true    Ran Out of Food in the Last Year: Never true  Transportation Needs: No Transportation Needs (07/19/2023)   PRAPARE - Administrator, Civil Service (Medical): No    Lack of Transportation (Non-Medical): No  Physical Activity: Insufficiently Active (07/19/2023)   Exercise Vital Sign    Days of Exercise per Week: 1 day  Minutes of Exercise per Session: 10 min  Stress: Not on file  Social Connections: Moderately Integrated (07/19/2023)   Social Connection and Isolation Panel    Frequency of Communication with Friends and Family: More than three times a week    Frequency of Social Gatherings with Friends and Family: Once a week    Attends Religious Services: More than 4 times per year    Active Member of [employer] or Organizations: Yes    Attends Banker Meetings: More than 4 times per year    Marital Status: Widowed     Family History: The patient's family history is not on file. ROS:   Please see the history of present illness.    All 14 point review of systems negative except as described per history of present illness.  EKGs/Labs/Other Studies Reviewed:    The following studies were reviewed today:   EKG:       Recent Labs: No results found for requested labs within last 365 days.  Recent Lipid Panel No results found for: CHOL, TRIG, HDL, CHOLHDL, VLDL, LDLCALC, LDLDIRECT  Physical Exam:    VS:  BP (!) 112/58   Pulse 64   Ht 6' (1.829 m)   Wt 144 lb 9.6 oz (65.6 kg)   SpO2 96%   BMI 19.61 kg/m     Wt Readings from Last 3 Encounters:  07/26/24 144 lb 9.6 oz (65.6 kg)  11/07/23 164 lb 3.2  oz (74.5 kg)  08/02/23 176 lb (79.8 kg)     GENERAL:  Well nourished, well developed in no acute distress NECK: No JVD; No carotid bruits CARDIAC: RRR, S1 and S2 present, no murmurs, no rubs, no gallops CHEST:  Clear to auscultation without rales, wheezing or rhonchi  Extremities: No pitting pedal edema, has compression socks on. Pulses bilaterally symmetric with radial 2+ and dorsalis pedis 2+ NEUROLOGIC:  Alert and oriented x 3  Medication Adjustments/Labs and Tests Ordered: Current medicines are reviewed at length with the patient today.  Concerns regarding medicines are outlined above.  Orders Placed This Encounter  Procedures   ECHOCARDIOGRAM COMPLETE   No orders of the defined types were placed in this encounter.   Signed, Daniel jess Kobus, MD, MPH, [facility]. 07/26/2024 9:53 AM    [facility]

## 2024-07-26 NOTE — Assessment & Plan Note (Signed)
 Recent blood work from [facility] 07/19/2024 reviewed Lipid panel total cholesterol 145, triglycerides 54, HDL 84 and LDL 49.  Optimal.   Continue atorvastatin 80 mg once daily.

## 2024-08-21 ENCOUNTER — Ambulatory Visit

## 2024-08-27 ENCOUNTER — Telehealth: Payer: Self-pay

## 2024-08-27 NOTE — Telephone Encounter (Signed)
"  ° °  Pre-operative Risk Assessment    Patient Name: Daniel Tran  DOB: 06-10-1946 MRN: [id number]   Date of last office visit: 07/26/24 Date of next office visit: none   Request for Surgical Clearance    Procedure:  rt carpal tunnel release, rt cubital tunnel release with anterior transposition, flexor-pronator tendon lengthening  Date of Surgery:  Clearance TBD                               Surgeon:  Dr. Carlin Galla Surgeon's Group or Practice Name:  [facility] Phone number:  [phone] Fax number:  [phone] Joen Sic   Type of Clearance Requested:   - Medical  - Pharmacy:  Hold Aspirin  and Clopidogrel  (Plavix )     Type of Anesthesia:  Block and MAC   Additional requests/questions:    Bonney Arlyne LITTIE Kallie   08/27/2024, 4:13 PM   "

## 2024-08-29 NOTE — Telephone Encounter (Signed)
" °  ° °  Primary Cardiologist: None  Chart reviewed as part of pre-operative protocol coverage. Given past medical history and time since last visit, based on ACC/AHA guidelines, Daniel Tran would be at acceptable risk for the planned procedure without further cardiovascular testing.   His Plavix  may be held for 5-7 days prior to his procedure.  We would recommend that he continue his aspirin  without interruption.  He is considered low risk from a cardiac standpoint for his upcoming procedure.  I will route this recommendation to the requesting party via Epic fax function and remove from pre-op pool.  Please call with questions.  [name]. [name] NP-C     08/29/2024, 10:04 AM [facility] [address] [place], [place] [address] Office [phone]      "

## 2024-08-29 NOTE — Telephone Encounter (Signed)
 Dr. Liborio,  Mr. Daniel Tran is requesting preoperative cardiac evaluation for right carpal tunnel release surgery.  He was last seen by you in clinic on 07/26/2024.  He remained stable at that time.  He underwent stress testing 07/25/2024 which showed low risk.  His PMH includes coronary artery disease status post NSTEMI 9/24, HTN, HLD, mild TR, and mild dementia.  Would you be able to comment on cardiac risk for upcoming surgery?  May his aspirin  and Plavix  be held prior to his surgery?  Thank you for your help.  Please direct her response to CVD IV preop pool.  Daniel Tran. Daniel Demedeiros NP-C     08/29/2024, 7:56 AM [facility] [address] [place], [place] [address] Office [phone]

## 2024-09-10 ENCOUNTER — Telehealth: Payer: Self-pay

## 2024-09-10 NOTE — Telephone Encounter (Signed)
 Patient would like to speak with a nurse in regards to his surgical clearance for his upcoming surgery on Thursday (1/8/), it looks like it's been cleared but he has questions. CB # [phone]

## 2024-09-10 NOTE — Telephone Encounter (Signed)
 Spoke with pt, advised that pre-op clearance says stop Plavix  5-7 days prior to carpal tunnel surgery but continue aspirin . Pt verbalized understanding and had no further questions.
